# Patient Record
Sex: Female | Born: 1990 | Race: White | Hispanic: No | Marital: Married | Smoking: Never smoker
Health system: Southern US, Community
[De-identification: ages and names within clinical notes are randomized; demographics above are authoritative.]

## PROBLEM LIST (undated history)

## (undated) ENCOUNTER — Inpatient Hospital Stay (HOSPITAL_COMMUNITY): Payer: Self-pay

## (undated) DIAGNOSIS — G43909 Migraine, unspecified, not intractable, without status migrainosus: Secondary | ICD-10-CM

## (undated) HISTORY — PX: ADENOIDECTOMY: SUR15

## (undated) HISTORY — PX: TONSILLECTOMY: SUR1361

## (undated) HISTORY — PX: EYE SURGERY: SHX253

---
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
## 2013-12-08 ENCOUNTER — Other Ambulatory Visit: Payer: Self-pay

## 2013-12-09 LAB — CYTOLOGY - PAP

## 2014-01-23 ENCOUNTER — Encounter (HOSPITAL_COMMUNITY): Payer: Self-pay | Admitting: *Deleted

## 2015-12-14 LAB — OB RESULTS CONSOLE HEPATITIS B SURFACE ANTIGEN: Hepatitis B Surface Ag: NEGATIVE

## 2015-12-14 LAB — OB RESULTS CONSOLE GC/CHLAMYDIA
Chlamydia: NEGATIVE
GC PROBE AMP, GENITAL: NEGATIVE

## 2015-12-14 LAB — OB RESULTS CONSOLE RUBELLA ANTIBODY, IGM: Rubella: IMMUNE

## 2015-12-14 LAB — OB RESULTS CONSOLE GBS: STREP GROUP B AG: NEGATIVE

## 2015-12-14 LAB — OB RESULTS CONSOLE ABO/RH: RH Type: POSITIVE

## 2015-12-14 LAB — OB RESULTS CONSOLE RPR: RPR: NONREACTIVE

## 2015-12-14 LAB — OB RESULTS CONSOLE HIV ANTIBODY (ROUTINE TESTING): HIV: NONREACTIVE

## 2015-12-21 ENCOUNTER — Encounter (HOSPITAL_COMMUNITY): Payer: Self-pay | Admitting: *Deleted

## 2015-12-21 ENCOUNTER — Inpatient Hospital Stay (HOSPITAL_COMMUNITY): Payer: BLUE CROSS/BLUE SHIELD

## 2015-12-21 ENCOUNTER — Inpatient Hospital Stay (HOSPITAL_COMMUNITY)
Admission: AD | Admit: 2015-12-21 | Discharge: 2015-12-21 | Disposition: A | Discharge status: Home / Self Care | Payer: BLUE CROSS/BLUE SHIELD | Source: Ambulatory Visit | Attending: Obstetrics and Gynecology | Admitting: Obstetrics and Gynecology

## 2015-12-21 DIAGNOSIS — O26891 Other specified pregnancy related conditions, first trimester: Secondary | ICD-10-CM | POA: Insufficient documentation

## 2015-12-21 DIAGNOSIS — O26899 Other specified pregnancy related conditions, unspecified trimester: Secondary | ICD-10-CM

## 2015-12-21 DIAGNOSIS — R1032 Left lower quadrant pain: Secondary | ICD-10-CM | POA: Insufficient documentation

## 2015-12-21 DIAGNOSIS — K5900 Constipation, unspecified: Secondary | ICD-10-CM | POA: Diagnosis not present

## 2015-12-21 DIAGNOSIS — O9989 Other specified diseases and conditions complicating pregnancy, childbirth and the puerperium: Secondary | ICD-10-CM

## 2015-12-21 DIAGNOSIS — R109 Unspecified abdominal pain: Secondary | ICD-10-CM | POA: Diagnosis not present

## 2015-12-21 DIAGNOSIS — Z3A09 9 weeks gestation of pregnancy: Secondary | ICD-10-CM | POA: Insufficient documentation

## 2015-12-21 DIAGNOSIS — Z3491 Encounter for supervision of normal pregnancy, unspecified, first trimester: Secondary | ICD-10-CM

## 2015-12-21 LAB — URINALYSIS, ROUTINE W REFLEX MICROSCOPIC
Bilirubin Urine: NEGATIVE
GLUCOSE, UA: NEGATIVE mg/dL
Hgb urine dipstick: NEGATIVE
Ketones, ur: NEGATIVE mg/dL
Nitrite: NEGATIVE
PROTEIN: NEGATIVE mg/dL
Specific Gravity, Urine: <1.005 — ABNORMAL LOW (ref 1.005–1.030)
pH: 5.5 (ref 5.0–8.0)

## 2015-12-21 LAB — URINE MICROSCOPIC-ADD ON
Bacteria, UA: NONE SEEN
RBC / HPF: NONE SEEN RBC/hpf (ref 0–5)

## 2015-12-21 LAB — POCT PREGNANCY, URINE: PREG TEST UR: POSITIVE — AB

## 2015-12-21 MED ORDER — DOCUSATE SODIUM 100 MG PO CAPS: 100.0000 mg | ORAL_CAPSULE | Freq: Two times a day (BID) | ORAL | 0 refills | Status: DC

## 2015-12-21 NOTE — Discharge Instructions (Signed)
Constipation, Adult °Constipation is when a person: °· Poops (has a bowel movement) less than 3 times a week. °· Has a hard time pooping. °· Has poop that is dry, hard, or bigger than normal. °HOME CARE  °· Eat foods with a lot of fiber in them. This includes fruits, vegetables, beans, and whole grains such as brown rice. °· Avoid fatty foods and foods with a lot of sugar. This includes french fries, hamburgers, cookies, candy, and soda. °· If you are not getting enough fiber from food, take products with added fiber in them (supplements). °· Drink enough fluid to keep your pee (urine) clear or pale yellow. °· Exercise on a regular basis, or as told by your doctor. °· Go to the restroom when you feel like you need to poop. Do not hold it. °· Only take medicine as told by your doctor. Do not take medicines that help you poop (laxatives) without talking to your doctor first. °GET HELP RIGHT AWAY IF:  °· You have bright red blood in your poop (stool). °· Your constipation lasts more than 4 days or gets worse. °· You have belly (abdominal) or butt (rectal) pain. °· You have thin poop (as thin as a pencil). °· You lose weight, and it cannot be explained. °MAKE SURE YOU:  °· Understand these instructions. °· Will watch your condition. °· Will get help right away if you are not doing well or get worse. °  °This information is not intended to replace advice given to you by your health care provider. Make sure you discuss any questions you have with your health care provider. °  °Document Released: 08/27/2007 Document Revised: 03/31/2014 Document Reviewed: 12/20/2012 °Elsevier Interactive Patient Education ©2016 Elsevier Inc[employer] °Abdominal Pain During Pregnancy °Belly (abdominal) pain is common during pregnancy. Most of the time, it is not a serious problem. Other times, it can be a sign that something is wrong with the pregnancy. Always tell your doctor if you have belly pain. °HOME CARE °Monitor your belly pain for any  changes. The following actions may help you feel better: °· Do not have sex (intercourse) or put anything in your vagina until you feel better. °· Rest until your pain stops. °· Drink clear fluids if you feel sick to your stomach (nauseous). Do not eat solid food until you feel better. °· Only take medicine as told by your doctor. °· Keep all doctor visits as told. °GET HELP RIGHT AWAY IF:  °· You are bleeding, leaking fluid, or pieces of tissue come out of your vagina. °· You have more pain or cramping. °· You keep throwing up (vomiting). °· You have pain when you pee (urinate) or have blood in your pee. °· You have a fever. °· You do not feel your baby moving as much. °· You feel very weak or feel like passing out. °· You have trouble breathing, with or without belly pain. °· You have a very bad headache and belly pain. °· You have fluid leaking from your vagina and belly pain. °· You keep having watery poop (diarrhea). °· Your belly pain does not go away after resting, or the pain gets worse. °MAKE SURE YOU:  °· Understand these instructions. °· Will watch your condition. °· Will get help right away if you are not doing well or get worse. °  °This information is not intended to replace advice given to you by your health care provider. Make sure you discuss any questions you have with your   you have with your health care provider.   Document Released: 02/26/2009 Document Revised: 11/10/2012 Document Reviewed: 10/07/2012 Elsevier Interactive Patient Education [employer]2016 [employer] Inc[employer]

## 2015-12-21 NOTE — MAU Note (Signed)
Patient states she has been having left sided lower abdominal cramping for past few days that comes and goes.  She has not tried taking any pain medications.  States she called the office and was told it was probably round ligament pain.  States "I've been pregnant before and never had round ligament pain this early."  Pt states pregnancy is an IUI.  Denies vaginal bleeding or discharge, but states she had a yellow/green discharge last week and bought OTC med for yeast infection and now discharge is gone.

## 2015-12-21 NOTE — MAU Provider Note (Signed)
History     CSN: [id number]  Arrival date and time: 12/21/15 1108    First Provider Initiated Contact with Patient 12/21/15 1251        Chief Complaint  Patient presents with  . Abdominal Cramping   HPI Nancy Arnold is a 25 y.o. G2P0101 at [redacted]w[redacted]d who presents with abdominal pain. Symptoms began 2 days ago. Reports LLQ pain that comes & goes and describes as pressure & cramping. Rates pain 3/10. Has not treated. Nothing makes better or worse. Denies n/v/d, vaginal bleeding, vaginal discharge, or dysuria. Endorses some constipation during this pregnancy. Last BM yesterday and prior to that was 3 days before. Pt states she is very anxious as this is an IUI pregnancy.   OB History    Gravida Para Term Preterm AB Living   2 1 0 1 0 1   SAB TAB Ectopic Multiple Live Births   0 0 0 0 1      History reviewed. No pertinent past medical history.  Past Surgical History:  Procedure Laterality Date  . ADENOIDECTOMY    . EYE SURGERY    . EYE SURGERY     age 38  to correct an eye that turned in  . TONSILLECTOMY      Family History  Problem Relation Age of Onset  . Anesthesia problems Other   . Hypertension Father   . Early death Paternal Uncle   . Cancer Paternal Uncle   . Arthritis Maternal Grandmother   . Hearing loss Maternal Grandmother   . Hypertension Maternal Grandmother   . Alcohol abuse Maternal Grandfather   . Arthritis Paternal Grandmother   . Asthma Paternal Grandmother   . COPD Paternal Grandmother   . Hypertension Paternal Grandmother   . Stroke Paternal Grandfather     Social History  Substance Use Topics  . Smoking status: Never Smoker  . Smokeless tobacco: Never Used  . Alcohol use No    Allergies: No Known Allergies  Prescriptions Prior to Admission  Medication Sig Dispense Refill Last Dose  . acetaminophen (TYLENOL) 500 MG tablet Take 500 mg by mouth every 6 (six) hours as needed. As needed for pain   Past Month at Unknown time  . calcium  carbonate (TUMS - DOSED IN MG ELEMENTAL CALCIUM) 500 MG chewable tablet Chew 1 tablet by mouth daily. As needed for heartburn   Past Month at Unknown time  . Prenatal Vit-Fe Fumarate-FA (MULTIVITAMIN-PRENATAL) 27-0.8 MG TABS tablet Take 1 tablet by mouth daily at 12 noon.   12/20/2015 at Unknown time    Review of Systems  Constitutional: Negative.   Gastrointestinal: Positive for abdominal pain and constipation. Negative for diarrhea, nausea and vomiting.  Genitourinary: Negative.    Physical Exam   Blood pressure 127/77, pulse 89, temperature 98.3 F (36.8 C), temperature source Oral, resp. rate 16, height 5\' 4"  (1.626 m), weight 160 lb 12.8 oz (72.9 kg), last menstrual period 10/16/2015, SpO2 100 %, unknown if currently breastfeeding.  Physical Exam  Nursing note and vitals reviewed. Constitutional: She is oriented to person, place, and time. She appears well-developed and well-nourished. No distress.  HENT:  Head: Normocephalic and atraumatic.  Eyes: Conjunctivae are normal. Right eye exhibits no discharge. Left eye exhibits no discharge. No scleral icterus.  Neck: Normal range of motion.  Respiratory: Effort normal. No respiratory distress.  GI: Soft. Bowel sounds are normal. She exhibits no distension. There is no tenderness. There is no rebound and no guarding.  Genitourinary:  Genitourinary Comments: Cervix closed  Neurological: She is alert and oriented to person, place, and time.  Skin: Skin is warm and dry. She is not diaphoretic.  Psychiatric: She has a normal mood and affect. Her behavior is normal. Judgment and thought content normal.    MAU Course  Procedures Results for orders placed or performed during the hospital encounter of 12/21/15 (from the past 24 hour(s))  Urinalysis, Routine w reflex microscopic (not at [facility])     Status: Abnormal   Collection Time: 12/21/15 11:15 AM  Result Value Ref Range   Color, Urine YELLOW YELLOW   APPearance CLEAR CLEAR   Specific  Gravity, Urine <1.005 (L) 1.005 - 1.030   pH 5.5 5.0 - 8.0   Glucose, UA NEGATIVE NEGATIVE mg/dL   Hgb urine dipstick NEGATIVE NEGATIVE   Bilirubin Urine NEGATIVE NEGATIVE   Ketones, ur NEGATIVE NEGATIVE mg/dL   Protein, ur NEGATIVE NEGATIVE mg/dL   Nitrite NEGATIVE NEGATIVE   Leukocytes, UA TRACE (A) NEGATIVE  Urine microscopic-add on     Status: Abnormal   Collection Time: 12/21/15 11:15 AM  Result Value Ref Range   Squamous Epithelial / LPF 0-5 (A) NONE SEEN   WBC, UA 0-5 0 - 5 WBC/hpf   RBC / HPF NONE SEEN 0 - 5 RBC/hpf   Bacteria, UA NONE SEEN NONE SEEN  Pregnancy, urine POC     Status: Abnormal   Collection Time: 12/21/15 11:33 AM  Result Value Ref Range   Preg Test, Ur POSITIVE (A) NEGATIVE   [place] Ob Comp Less 14 Wks  Result Date: 12/21/2015 CLINICAL DATA:  Pregnant patient with abdominal and left lower quadrant pain for 3 days which worsened last night. EXAM: OBSTETRIC <14 WK US AND TRANSVAGINAL OB US TECHNIQUE: Both transabdominal and transvaginal ultrasound examinations were performed for complete evaluation of the gestation as well as the maternal uterus, adnexal regions, and pelvic cul-de-sac. Transvaginal technique was performed to assess early pregnancy. COMPARISON:  None. FINDINGS: Intrauterine gestational sac: Visualized. Yolk sac:  Visualized. Embryo:  Visualized. Cardiac Activity: Detected. Heart Rate: 174  Bpm CRL:  21.0  mm   8 w   4 d                  US EDC: 07/28/2016. Subchorionic hemorrhage:  None visualized. Maternal uterus/adnexae: Corpus luteum cyst on the right is incidentally noted. No free pelvic fluid. IMPRESSION: No acute abnormality.  Single living intrauterine pregnancy. Electronically Signed   By: Drusilla Kannerhomas  Dalessio M.D.   On: 12/21/2015 13:55   [place] Ob Transvaginal  Result Date: 12/21/2015 CLINICAL DATA:  Pelvic pain for 3 days.  Positive pregnancy test. EXAM: OBSTETRIC <14 WK US AND TRANSVAGINAL OB US TECHNIQUE: Both transabdominal and transvaginal  ultrasound examinations were performed for complete evaluation of the gestation as well as the maternal uterus, adnexal regions, and pelvic cul-de-sac. Transvaginal technique was performed to assess early pregnancy. COMPARISON:  None. FINDINGS: Intrauterine gestational sac: Present Yolk sac:  Present Embryo:  Present Cardiac Activity: Present Heart Rate: 170 for  bpm CRL:  21.0  mm   8 w   4 d                  US EDC: 07/28/2016 Subchorionic hemorrhage:  None visualized. Maternal uterus/adnexae: Corpus luteum cyst right ovary. Normal left ovary. No free pelvic fluid IMPRESSION: Single living intrauterine fetus estimated at 8 weeks and 4 days gestation. No subchorionic hemorrhage. Normal ovaries. Electronically Signed   By: Demetrius CharityP.  Gallerani M.D.   On: 12/21/2015 13:56    MDM Cervix closed Ultrasound shows SIUP with cardiac activity. CLC in right ovary Discussed with Dr. Elon Spanner -- ok to discharge home with reassurance Assessment and Plan  A; 1. Normal IUP (intrauterine pregnancy) on prenatal ultrasound, first trimester   2. Abdominal pain affecting pregnancy   3. Constipation, unspecified constipation type    P: Discharge home Rx colace Discussed reasons to return to MAU Keep f/u with OB    Judeth Horn 12/21/2015, 12:51 PM

## 2016-02-27 ENCOUNTER — Encounter: Payer: Self-pay | Admitting: Physician Assistant

## 2016-02-27 ENCOUNTER — Encounter (INDEPENDENT_AMBULATORY_CARE_PROVIDER_SITE_OTHER): Payer: Self-pay

## 2016-02-27 ENCOUNTER — Ambulatory Visit (INDEPENDENT_AMBULATORY_CARE_PROVIDER_SITE_OTHER): Payer: 59 | Admitting: Physician Assistant

## 2016-02-27 VITALS — BP 110/82 | HR 110 | Temp 98.0°F | Ht 64.0 in | Wt 168.0 lb

## 2016-02-27 DIAGNOSIS — J020 Streptococcal pharyngitis: Secondary | ICD-10-CM | POA: Diagnosis not present

## 2016-02-27 DIAGNOSIS — J029 Acute pharyngitis, unspecified: Secondary | ICD-10-CM | POA: Diagnosis not present

## 2016-02-27 LAB — RAPID STREP SCREEN (MED CTR MEBANE ONLY): STREP GP A AG, IA W/REFLEX: POSITIVE — AB

## 2016-02-27 MED ORDER — AMOXICILLIN 500 MG PO CAPS
500.0000 mg | ORAL_CAPSULE | Freq: Three times a day (TID) | ORAL | 0 refills | Status: DC
Start: 2016-02-27 — End: 2016-04-17

## 2016-02-27 NOTE — Patient Instructions (Signed)

## 2016-02-27 NOTE — Progress Notes (Signed)
BP 110/82   Pulse (!) 110   Temp 98 F (36.7 C) (Oral)   Ht 5\' 4"  (1.626 m)   Wt 168 lb (76.2 kg)   LMP 10/16/2015 (Exact Date)   BMI 28.84 kg/m    Subjective:    Patient ID: Nancy Arnold, female    DOB: 12-15-1990, 25 y.o.   MRN: [id number]  HPI: Nancy Arnold is a 25 y.o. female presenting on 02/27/2016 for Cough ([redacted] weeks pregnant ); Sore Throat; and Hoarse  This is a new patient to us. She has had this for about 4 days now. It has been worsening with body aches and fever. She had a significant sore throat. Patient is [redacted] weeks pregnant. She has had a significant problem with morning sickness. Otherwise her pregnancy has been uneventful. She has no known allergies.  History reviewed. No pertinent past medical history. Relevant past medical, surgical, family and social history reviewed and updated as indicated. Interim medical history since our last visit reviewed. Allergies and medications reviewed and updated. DATA REVIEWED: CHART IN EPIC  Social History   Social History  . Marital status: Married    Spouse name: N/A  . Number of children: N/A  . Years of education: N/A   Occupational History  . Not on file.   Social History Main Topics  . Smoking status: Never Smoker  . Smokeless tobacco: Never Used  . Alcohol use No  . Drug use: No  . Sexual activity: Yes    Birth control/ protection: None   Other Topics Concern  . Not on file   Social History Narrative  . No narrative on file    Past Surgical History:  Procedure Laterality Date  . ADENOIDECTOMY    . EYE SURGERY    . EYE SURGERY     age 42  to correct an eye that turned in  . TONSILLECTOMY      Family History  Problem Relation Age of Onset  . Anesthesia problems Other   . Hypertension Father   . Early death Paternal Uncle   . Cancer Paternal Uncle   . Arthritis Maternal Grandmother   . Hearing loss Maternal Grandmother   . Hypertension Maternal Grandmother   . Alcohol abuse Maternal  Grandfather   . Arthritis Paternal Grandmother   . Asthma Paternal Grandmother   . COPD Paternal Grandmother   . Hypertension Paternal Grandmother   . Stroke Paternal Grandfather     Review of Systems  Constitutional: Positive for chills, fatigue and fever. Negative for activity change and appetite change.  HENT: Positive for congestion, postnasal drip and sore throat. Negative for ear pain.   Eyes: Negative.   Respiratory: Positive for cough and wheezing.   Cardiovascular: Negative.  Negative for chest pain, palpitations and leg swelling.  Gastrointestinal: Negative.   Genitourinary: Negative.   Musculoskeletal: Negative.   Skin: Negative.   Neurological: Positive for headaches.      Medication List       Accurate as of 02/27/16 11:10 AM. Always use your most recent med list.          amoxicillin 500 MG capsule Commonly known as:  AMOXIL Take 1 capsule (500 mg total) by mouth 3 (three) times daily.   multivitamin-prenatal 27-0.8 MG Tabs tablet Take 1 tablet by mouth daily at 12 noon.          Objective:    BP 110/82   Pulse (!) 110   Temp 98 F (36.7  C) (Oral)   Ht 5\' 4"  (1.626 m)   Wt 168 lb (76.2 kg)   LMP 10/16/2015 (Exact Date)   BMI 28.84 kg/m   No Known Allergies  Wt Readings from Last 3 Encounters:  02/27/16 168 lb (76.2 kg)  12/21/15 160 lb 12.8 oz (72.9 kg)  07/25/11 181 lb 11.2 oz (82.4 kg) (95 %, Z= 1.66)*   * Growth percentiles are based on CDC 2-20 Years data.    Physical Exam  Constitutional: She is oriented to person, place, and time. She appears well-developed and well-nourished.  HENT:  Head: Normocephalic and atraumatic.  Right Ear: A middle ear effusion is present.  Left Ear: A middle ear effusion is present.  Nose: Mucosal edema present. Right sinus exhibits no frontal sinus tenderness. Left sinus exhibits no frontal sinus tenderness.  Mouth/Throat: Oropharyngeal exudate, posterior oropharyngeal edema and posterior oropharyngeal  erythema present. No tonsillar abscesses.  Eyes: Conjunctivae and EOM are normal. Pupils are equal, round, and reactive to light.  Neck: Normal range of motion.  Cardiovascular: Normal rate, regular rhythm, normal heart sounds and intact distal pulses.   Pulmonary/Chest: Effort normal and breath sounds normal.  Abdominal: Soft. Bowel sounds are normal.  Neurological: She is alert and oriented to person, place, and time. She has normal reflexes.  Skin: Skin is warm and dry. No rash noted.  Psychiatric: She has a normal mood and affect. Her behavior is normal. Judgment and thought content normal.  Nursing note and vitals reviewed.       Assessment & Plan:   1. Sore throat - Rapid strep screen (not at [facility])  2. Streptococcal sore throat - amoxicillin (AMOXIL) 500 MG capsule; Take 1 capsule (500 mg total) by mouth 3 (three) times daily.  Dispense: 30 capsule; Refill: 0   Continue all other maintenance medications as listed above.  Follow up plan: Return if symptoms worsen or fail to improve.  Orders Placed This Encounter  Procedures  . Rapid strep screen (not at [facility])    Educational handout given for strep pharyngitis  Remus LofflerAngel S. Marijean Montanye PA-C [facility] [address]  [place], [place] [id number] [phone]   02/27/2016, 11:10 AM

## 2016-03-24 NOTE — L&D Delivery Note (Addendum)
Delivery Note At 1:46 AM a viable female was delivered via Vaginal, Spontaneous Delivery (Presentation: DOA;  ).  APGAR: pending, ; weight pending .   Placenta status: Normal except possible short cord, .  Cord: mildly short, with the following complications: normal.  Cord pH: sent  Anesthesia:  CLE Episiotomy:  None Lacerations:  None Suture Repair: None Est. Blood Loss (mL):  200cc  Mom to postpartum.  Baby to Couplet care / Skin to Skin.  It's a boy - "Nancy Arnold" to join brother, [name] 07/06/2016, 1:56 AM

## 2016-04-17 ENCOUNTER — Ambulatory Visit (INDEPENDENT_AMBULATORY_CARE_PROVIDER_SITE_OTHER): Payer: 59 | Admitting: Physician Assistant

## 2016-04-17 ENCOUNTER — Encounter: Payer: Self-pay | Admitting: Physician Assistant

## 2016-04-17 VITALS — BP 107/68 | HR 96 | Temp 98.1°F | Ht 64.0 in | Wt 174.2 lb

## 2016-04-17 DIAGNOSIS — Z0000 Encounter for general adult medical examination without abnormal findings: Secondary | ICD-10-CM | POA: Diagnosis not present

## 2016-04-17 NOTE — Patient Instructions (Signed)

## 2016-04-17 NOTE — Progress Notes (Signed)
BP 107/68   Pulse 96   Temp 98.1 F (36.7 C) (Oral)   Ht _0  (1.626 m)   Wt 174 lb 3.2 oz (79 kg)   LMP 10/16/2015 (Exact Date)   BMI 29.90 kg/m    Subjective:    Patient ID: Nancy Arnold, female    DOB: 1990/06/27, 26 y.o.   MRN: [id number]  HPI: Nancy Arnold is a 26 y.o. female presenting on 04/17/2016 for Annual Exam  This patient comes in for annual well physical examination. All medications are reviewed today. There are no reports of any problems with the medications. All of the medical conditions are reviewed and updated.  Lab work is reviewed and will be ordered as medically necessary. There are no new problems reported with today's visit.  Patient reports doing well overall.   History reviewed. No pertinent past medical history. Relevant past medical, surgical, family and social history reviewed and updated as indicated. Interim medical history since our last visit reviewed. Allergies and medications reviewed and updated. DATA REVIEWED: CHART IN EPIC  Social History   Social History  . Marital status: Married    Spouse name: N/A  . Number of children: N/A  . Years of education: N/A   Occupational History  . Not on file.   Social History Main Topics  . Smoking status: Never Smoker  . Smokeless tobacco: Never Used  . Alcohol use No  . Drug use: No  . Sexual activity: Yes    Birth control/ protection: None   Other Topics Concern  . Not on file   Social History Narrative  . No narrative on file    Past Surgical History:  Procedure Laterality Date  . ADENOIDECTOMY    . EYE SURGERY    . EYE SURGERY     age 14  to correct an eye that turned in  . TONSILLECTOMY      Family History  Problem Relation Age of Onset  . Anesthesia problems Other   . Hypertension Father   . Early death Paternal Uncle   . Cancer Paternal Uncle   . Arthritis Maternal Grandmother   . Hearing loss Maternal Grandmother   . Hypertension Maternal Grandmother   .  Alcohol abuse Maternal Grandfather   . Arthritis Paternal Grandmother   . Asthma Paternal Grandmother   . COPD Paternal Grandmother   . Hypertension Paternal Grandmother   . Stroke Paternal Grandfather     Review of Systems  Constitutional: Negative.  Negative for activity change, fatigue and fever.  HENT: Negative.   Eyes: Negative.   Respiratory: Negative.  Negative for cough.   Cardiovascular: Negative.  Negative for chest pain.  Gastrointestinal: Negative.  Negative for abdominal pain.  Endocrine: Negative.   Genitourinary: Negative.  Negative for dysuria.  Musculoskeletal: Negative.   Skin: Negative.   Neurological: Negative.     Allergies as of 04/17/2016   No Known Allergies     Medication List       Accurate as of 04/17/16 10:17 PM. Always use your most recent med list.          multivitamin-prenatal 27-0.8 MG Tabs tablet Take 1 tablet by mouth daily at 12 noon.          Objective:    BP 107/68   Pulse 96   Temp 98.1 F (36.7 C) (Oral)   Ht _1  (1.626 m)   Wt 174 lb 3.2 oz (79 kg)   LMP 10/16/2015 (Exact  Date)   BMI 29.90 kg/m   No Known Allergies  Wt Readings from Last 3 Encounters:  04/17/16 174 lb 3.2 oz (79 kg)  02/27/16 168 lb (76.2 kg)  12/21/15 160 lb 12.8 oz (72.9 kg)    Physical Exam  Constitutional: She is oriented to person, place, and time. She appears well-developed and well-nourished.  HENT:  Head: Normocephalic and atraumatic.  Right Ear: Tympanic membrane, external ear and ear canal normal.  Left Ear: Tympanic membrane, external ear and ear canal normal.  Nose: Nose normal. No rhinorrhea.  Mouth/Throat: Oropharynx is clear and moist and mucous membranes are normal. No oropharyngeal exudate or posterior oropharyngeal erythema.  Eyes: Conjunctivae and EOM are normal. Pupils are equal, round, and reactive to light.  Neck: Normal range of motion. Neck supple.  Cardiovascular: Normal rate, regular rhythm, normal heart sounds and  intact distal pulses.   Pulmonary/Chest: Effort normal and breath sounds normal.  Abdominal: Soft. Bowel sounds are normal.  Neurological: She is alert and oriented to person, place, and time. She has normal reflexes.  Skin: Skin is warm and dry. No rash noted.  Psychiatric: She has a normal mood and affect. Her behavior is normal. Judgment and thought content normal.        Assessment & Plan:   1. Well adult exam - CMP14+EGFR - CBC with Differential/Platelet - Lipid panel   Continue all other maintenance medications as listed above.  Follow up plan: Return if symptoms worsen or fail to improve.  Orders Placed This Encounter  Procedures  . CMP14+EGFR  . CBC with Differential/Platelet  . Lipid panel    Educational handout given for health maintenance  Terald Sleeper PA-C [facility] [address]  [place], [place] [address] [phone]   04/17/2016, 10:17 PM

## 2016-04-18 LAB — CBC WITH DIFFERENTIAL/PLATELET
BASOS ABS: 0 10*3/uL (ref 0.0–0.2)
BASOS: 0 %
EOS (ABSOLUTE): 0.1 10*3/uL (ref 0.0–0.4)
Eos: 1 %
Hematocrit: 36.8 % (ref 34.0–46.6)
Hemoglobin: 12.2 g/dL (ref 11.1–15.9)
IMMATURE GRANS (ABS): 0.1 10*3/uL (ref 0.0–0.1)
IMMATURE GRANULOCYTES: 1 %
LYMPHS: 11 %
Lymphocytes Absolute: 1.5 10*3/uL (ref 0.7–3.1)
MCH: 29.3 pg (ref 26.6–33.0)
MCHC: 33.2 g/dL (ref 31.5–35.7)
MCV: 88 fL (ref 79–97)
MONOS ABS: 0.5 10*3/uL (ref 0.1–0.9)
Monocytes: 4 %
NEUTROS PCT: 83 %
Neutrophils Absolute: 10.8 10*3/uL — ABNORMAL HIGH (ref 1.4–7.0)
PLATELETS: 227 10*3/uL (ref 150–379)
RBC: 4.17 x10E6/uL (ref 3.77–5.28)
RDW: 13.6 % (ref 12.3–15.4)
WBC: 13.1 10*3/uL — AB (ref 3.4–10.8)

## 2016-04-18 LAB — CMP14+EGFR
A/G RATIO: 1.5 (ref 1.2–2.2)
ALK PHOS: 103 IU/L (ref 39–117)
ALT: 12 IU/L (ref 0–32)
AST: 12 IU/L (ref 0–40)
Albumin: 3.8 g/dL (ref 3.5–5.5)
BUN/Creatinine Ratio: 9 (ref 9–23)
BUN: 6 mg/dL (ref 6–20)
Bilirubin Total: 0.2 mg/dL (ref 0.0–1.2)
CALCIUM: 9 mg/dL (ref 8.7–10.2)
CHLORIDE: 100 mmol/L (ref 96–106)
CO2: 21 mmol/L (ref 18–29)
Creatinine, Ser: 0.69 mg/dL (ref 0.57–1.00)
GFR calc Af Amer: 142 mL/min/{1.73_m2} (ref >59)
GFR, EST NON AFRICAN AMERICAN: 123 mL/min/{1.73_m2} (ref >59)
Globulin, Total: 2.5 g/dL (ref 1.5–4.5)
Glucose: 68 mg/dL (ref 65–99)
POTASSIUM: 4.2 mmol/L (ref 3.5–5.2)
SODIUM: 140 mmol/L (ref 134–144)
Total Protein: 6.3 g/dL (ref 6.0–8.5)

## 2016-04-18 LAB — LIPID PANEL
CHOLESTEROL TOTAL: 203 mg/dL — AB (ref 100–199)
Chol/HDL Ratio: 3.3 ratio units (ref 0.0–4.4)
HDL: 62 mg/dL (ref >39)
LDL Calculated: 105 mg/dL — ABNORMAL HIGH (ref 0–99)
Triglycerides: 179 mg/dL — ABNORMAL HIGH (ref 0–149)
VLDL Cholesterol Cal: 36 mg/dL (ref 5–40)

## 2016-04-22 DIAGNOSIS — Z23 Encounter for immunization: Secondary | ICD-10-CM | POA: Diagnosis not present

## 2016-07-05 ENCOUNTER — Inpatient Hospital Stay (HOSPITAL_COMMUNITY)
Admission: AD | Admit: 2016-07-05 | Discharge: 2016-07-07 | DRG: 775 | Disposition: A | Discharge status: Home / Self Care | Payer: 59 | Source: Ambulatory Visit | Attending: Obstetrics and Gynecology | Admitting: Obstetrics and Gynecology

## 2016-07-05 ENCOUNTER — Inpatient Hospital Stay (HOSPITAL_COMMUNITY): Payer: 59 | Admitting: Anesthesiology

## 2016-07-05 ENCOUNTER — Encounter (HOSPITAL_COMMUNITY): Payer: Self-pay

## 2016-07-05 ENCOUNTER — Inpatient Hospital Stay (EMERGENCY_DEPARTMENT_HOSPITAL)
Admission: AD | Admit: 2016-07-05 | Discharge: 2016-07-05 | Disposition: A | Discharge status: Home / Self Care | Payer: 59 | Source: Ambulatory Visit | Attending: Obstetrics and Gynecology | Admitting: Obstetrics and Gynecology

## 2016-07-05 ENCOUNTER — Encounter (HOSPITAL_COMMUNITY): Payer: Self-pay | Admitting: Certified Nurse Midwife

## 2016-07-05 DIAGNOSIS — O4290 Premature rupture of membranes, unspecified as to length of time between rupture and onset of labor, unspecified weeks of gestation: Secondary | ICD-10-CM | POA: Diagnosis not present

## 2016-07-05 DIAGNOSIS — O26893 Other specified pregnancy related conditions, third trimester: Secondary | ICD-10-CM

## 2016-07-05 DIAGNOSIS — N898 Other specified noninflammatory disorders of vagina: Secondary | ICD-10-CM

## 2016-07-05 DIAGNOSIS — Z823 Family history of stroke: Secondary | ICD-10-CM

## 2016-07-05 DIAGNOSIS — Z3A37 37 weeks gestation of pregnancy: Secondary | ICD-10-CM | POA: Insufficient documentation

## 2016-07-05 DIAGNOSIS — Z3490 Encounter for supervision of normal pregnancy, unspecified, unspecified trimester: Secondary | ICD-10-CM

## 2016-07-05 DIAGNOSIS — Z8249 Family history of ischemic heart disease and other diseases of the circulatory system: Secondary | ICD-10-CM

## 2016-07-05 DIAGNOSIS — O4292 Full-term premature rupture of membranes, unspecified as to length of time between rupture and onset of labor: Secondary | ICD-10-CM | POA: Diagnosis not present

## 2016-07-05 LAB — TYPE AND SCREEN
ABO/RH(D): O POS
Antibody Screen: NEGATIVE

## 2016-07-05 LAB — CBC
HEMATOCRIT: 32.6 % — AB (ref 36.0–46.0)
HEMOGLOBIN: 10.4 g/dL — AB (ref 12.0–15.0)
MCH: 24.5 pg — AB (ref 26.0–34.0)
MCHC: 31.9 g/dL (ref 30.0–36.0)
MCV: 76.9 fL — ABNORMAL LOW (ref 78.0–100.0)
Platelets: 250 10*3/uL (ref 150–400)
RBC: 4.24 MIL/uL (ref 3.87–5.11)
RDW: 14.5 % (ref 11.5–15.5)
WBC: 12.7 10*3/uL — ABNORMAL HIGH (ref 4.0–10.5)

## 2016-07-05 LAB — ABO/RH: ABO/RH(D): O POS

## 2016-07-05 LAB — POCT FERN TEST: POCT Fern Test: POSITIVE

## 2016-07-05 LAB — AMNISURE RUPTURE OF MEMBRANE (ROM) NOT AT ARMC: Amnisure ROM: NEGATIVE

## 2016-07-05 MED ORDER — OXYTOCIN 40 UNITS IN LACTATED RINGERS INFUSION - SIMPLE MED
1.0000 m[IU]/min | INTRAVENOUS | Status: DC
  Administered 2016-07-05: 2 m[IU]/min via INTRAVENOUS

## 2016-07-05 MED ORDER — LIDOCAINE HCL (PF) 1 % IJ SOLN
30.0000 mL | INTRAMUSCULAR | Status: DC | PRN
Start: 2016-07-05 — End: 2016-07-06
  Filled 2016-07-05: qty 30

## 2016-07-05 MED ORDER — LACTATED RINGERS IV SOLN
500.0000 mL | Freq: Once | INTRAVENOUS | Status: AC
  Administered 2016-07-05: 500 mL via INTRAVENOUS

## 2016-07-05 MED ORDER — OXYTOCIN BOLUS FROM INFUSION
500.0000 mL | Freq: Once | INTRAVENOUS | Status: AC
  Administered 2016-07-06: 500 mL via INTRAVENOUS

## 2016-07-05 MED ORDER — ZOLPIDEM TARTRATE 5 MG PO TABS
5.0000 mg | ORAL_TABLET | Freq: Every evening | ORAL | Status: DC | PRN
  Administered 2016-07-05: 5 mg via ORAL
  Filled 2016-07-05: qty 1

## 2016-07-05 MED ORDER — PHENYLEPHRINE 40 MCG/ML (10ML) SYRINGE FOR IV PUSH (FOR BLOOD PRESSURE SUPPORT)
80.0000 ug | PREFILLED_SYRINGE | INTRAVENOUS | Status: DC | PRN
  Filled 2016-07-05: qty 5

## 2016-07-05 MED ORDER — LIDOCAINE HCL (PF) 1 % IJ SOLN
INTRAMUSCULAR | Status: DC | PRN
  Administered 2016-07-05 (×2): 5 mL via EPIDURAL

## 2016-07-05 MED ORDER — EPHEDRINE 5 MG/ML INJ
10.0000 mg | INTRAVENOUS | Status: DC | PRN
  Filled 2016-07-05: qty 2

## 2016-07-05 MED ORDER — OXYCODONE-ACETAMINOPHEN 5-325 MG PO TABS: 1.0000 | ORAL_TABLET | ORAL | Status: DC | PRN

## 2016-07-05 MED ORDER — PHENYLEPHRINE 40 MCG/ML (10ML) SYRINGE FOR IV PUSH (FOR BLOOD PRESSURE SUPPORT)
80.0000 ug | PREFILLED_SYRINGE | INTRAVENOUS | Status: DC | PRN
  Filled 2016-07-05: qty 5
  Filled 2016-07-05: qty 10

## 2016-07-05 MED ORDER — SOD CITRATE-CITRIC ACID 500-334 MG/5ML PO SOLN: 30.0000 mL | ORAL | Status: DC | PRN

## 2016-07-05 MED ORDER — LACTATED RINGERS IV SOLN
INTRAVENOUS | Status: DC
  Administered 2016-07-05: 17:00:00 via INTRAVENOUS

## 2016-07-05 MED ORDER — OXYTOCIN 40 UNITS IN LACTATED RINGERS INFUSION - SIMPLE MED: 2.5000 [IU]/h | INTRAVENOUS | Status: DC

## 2016-07-05 MED ORDER — TERBUTALINE SULFATE 1 MG/ML IJ SOLN
0.2500 mg | Freq: Once | INTRAMUSCULAR | Status: DC | PRN
  Filled 2016-07-05: qty 1

## 2016-07-05 MED ORDER — OXYCODONE-ACETAMINOPHEN 5-325 MG PO TABS: 2.0000 | ORAL_TABLET | ORAL | Status: DC | PRN

## 2016-07-05 MED ORDER — ONDANSETRON HCL 4 MG/2ML IJ SOLN
4.0000 mg | Freq: Four times a day (QID) | INTRAMUSCULAR | Status: DC | PRN
  Administered 2016-07-06: 4 mg via INTRAVENOUS
  Filled 2016-07-05: qty 2

## 2016-07-05 MED ORDER — FLEET ENEMA 7-19 GM/118ML RE ENEM: 1.0000 | ENEMA | RECTAL | Status: DC | PRN

## 2016-07-05 MED ORDER — ACETAMINOPHEN 325 MG PO TABS
650.0000 mg | ORAL_TABLET | ORAL | Status: DC | PRN
  Administered 2016-07-05: 650 mg via ORAL
  Filled 2016-07-05: qty 2

## 2016-07-05 MED ORDER — LACTATED RINGERS IV SOLN
500.0000 mL | INTRAVENOUS | Status: DC | PRN
Start: 2016-07-05 — End: 2016-07-06

## 2016-07-05 MED ORDER — OXYTOCIN 40 UNITS IN LACTATED RINGERS INFUSION - SIMPLE MED
INTRAVENOUS | Status: AC
  Administered 2016-07-05: 2 m[IU]/min via INTRAVENOUS
  Filled 2016-07-05: qty 1000

## 2016-07-05 MED ORDER — FENTANYL 2.5 MCG/ML BUPIVACAINE 1/10 % EPIDURAL INFUSION (WH - ANES)
14.0000 mL/h | INTRAMUSCULAR | Status: DC | PRN
  Filled 2016-07-05: qty 100

## 2016-07-05 MED ORDER — DIPHENHYDRAMINE HCL 50 MG/ML IJ SOLN: 12.5000 mg | INTRAMUSCULAR | Status: DC | PRN

## 2016-07-05 NOTE — Anesthesia Procedure Notes (Signed)
Epidural Patient location during procedure: OB Start time: 07/05/2016 7:42 PM  Staffing Anesthesiologist: Odette Fraction Performed: anesthesiologist   Preanesthetic Checklist Completed: patient identified, site marked, surgical consent, pre-op evaluation, timeout performed, IV checked, risks and benefits discussed and monitors and equipment checked  Epidural Patient position: sitting Prep: DuraPrep Patient monitoring: heart rate, continuous pulse ox and blood pressure Approach: midline Location: L3-L4 Injection technique: LOR air and LOR saline  Needle:  Needle type: Tuohy  Needle gauge: 18 G Needle length: 9 cm Needle insertion depth: 7 cm Catheter type: closed end flexible Catheter size: 20 Guage Catheter at skin depth: 12 cm  Assessment Sensory level: T9  Additional Notes I was present and participated in the regional anesthetic procedure key events.  By signing, I attest that I have identified and re-evaluated the patient immediately before the induction of anesthesia and I am satisfied that my anesthetic plan is suitable for the patient's condition and procedure.

## 2016-07-05 NOTE — Discharge Instructions (Signed)
Fetal Movement Counts Patient Name: ________________________________________________ Patient Due Date: ____________________ What is a fetal movement count? A fetal movement count is the number of times that you feel your baby move during a certain amount of time. This may also be called a fetal kick count. A fetal movement count is recommended for every pregnant woman. You may be asked to start counting fetal movements as early as week 28 of your pregnancy. Pay attention to when your baby is most active. You may notice your baby's sleep and wake cycles. You may also notice things that make your baby move more. You should do a fetal movement count:  When your baby is normally most active.  At the same time each day. A good time to count movements is while you are resting, after having something to eat and drink. How do I count fetal movements? 1. Find a quiet, comfortable area. Sit, or lie down on your side. 2. Write down the date, the start time and stop time, and the number of movements that you felt between those two times. Take this information with you to your health care visits. 3. For 2 hours, count kicks, flutters, swishes, rolls, and jabs. You should feel at least 10 movements during 2 hours. 4. You may stop counting after you have felt 10 movements. 5. If you do not feel 10 movements in 2 hours, have something to eat and drink. Then, keep resting and counting for 1 hour. If you feel at least 4 movements during that hour, you may stop counting. Contact a health care provider if:  You feel fewer than 4 movements in 2 hours.  Your baby is not moving like he or she usually does. Date: ____________ Start time: ____________ Stop time: ____________ Movements: ____________ Date: ____________ Start time: ____________ Stop time: ____________ Movements: ____________ Date: ____________ Start time: ____________ Stop time: ____________ Movements: ____________ Date: ____________ Start time:  ____________ Stop time: ____________ Movements: ____________ Date: ____________ Start time: ____________ Stop time: ____________ Movements: ____________ Date: ____________ Start time: ____________ Stop time: ____________ Movements: ____________ Date: ____________ Start time: ____________ Stop time: ____________ Movements: ____________ Date: ____________ Start time: ____________ Stop time: ____________ Movements: ____________ Date: ____________ Start time: ____________ Stop time: ____________ Movements: ____________ This information is not intended to replace advice given to you by your health care provider. Make sure you discuss any questions you have with your health care provider. Document Released: 04/09/2006 Document Revised: 11/07/2015 Document Reviewed: 04/19/2015 Elsevier Interactive Patient Education  2017 [employer]. Third Trimester of Pregnancy The third trimester is from week 28 through week 40 (months 7 through 9). The third trimester is a time when the unborn baby (fetus) is growing rapidly. At the end of the ninth month, the fetus is about 20 inches in length and weighs 6-10 pounds. Body changes during your third trimester Your body will continue to go through many changes during pregnancy. The changes vary from woman to woman. During the third trimester:  Your weight will continue to increase. You can expect to gain 25-35 pounds (11-16 kg) by the end of the pregnancy.  You may begin to get stretch marks on your hips, abdomen, and breasts.  You may urinate more often because the fetus is moving lower into your pelvis and pressing on your bladder.  You may develop or continue to have heartburn. This is caused by increased hormones that slow down muscles in the digestive tract.  You may develop or continue to have constipation because increased hormones  slow digestion and cause the muscles that push waste through your intestines to relax.  You may develop hemorrhoids. These are  swollen veins (varicose veins) in the rectum that can itch or be painful.  You may develop swollen, bulging veins (varicose veins) in your legs.  You may have increased body aches in the pelvis, back, or thighs. This is due to weight gain and increased hormones that are relaxing your joints.  You may have changes in your hair. These can include thickening of your hair, rapid growth, and changes in texture. Some women also have hair loss during or after pregnancy, or hair that feels dry or thin. Your hair will most likely return to normal after your baby is born.  Your breasts will continue to grow and they will continue to become tender. A yellow fluid (colostrum) may leak from your breasts. This is the first milk you are producing for your baby.  Your belly button may stick out.  You may notice more swelling in your hands, face, or ankles.  You may have increased tingling or numbness in your hands, arms, and legs. The skin on your belly may also feel numb.  You may feel short of breath because of your expanding uterus.  You may have more problems sleeping. This can be caused by the size of your belly, increased need to urinate, and an increase in your body's metabolism.  You may notice the fetus "dropping," or moving lower in your abdomen (lightening).  You may have increased vaginal discharge.  You may notice your joints feel loose and you may have pain around your pelvic bone. What to expect at prenatal visits You will have prenatal exams every 2 weeks until week 36. Then you will have weekly prenatal exams. During a routine prenatal visit:  You will be weighed to make sure you and the baby are growing normally.  Your blood pressure will be taken.  Your abdomen will be measured to track your baby's growth.  The fetal heartbeat will be listened to.  Any test results from the previous visit will be discussed.  You may have a cervical check near your due date to see if your  cervix has softened or thinned (effaced).  You will be tested for Group B streptococcus. This happens between 35 and 37 weeks. Your health care provider may ask you:  What your birth plan is.  How you are feeling.  If you are feeling the baby move.  If you have had any abnormal symptoms, such as leaking fluid, bleeding, severe headaches, or abdominal cramping.  If you are using any tobacco products, including cigarettes, chewing tobacco, and electronic cigarettes.  If you have any questions. Other tests or screenings that may be performed during your third trimester include:  Blood tests that check for low iron levels (anemia).  Fetal testing to check the health, activity level, and growth of the fetus. Testing is done if you have certain medical conditions or if there are problems during the pregnancy.  Nonstress test (NST). This test checks the health of your baby to make sure there are no signs of problems, such as the baby not getting enough oxygen. During this test, a belt is placed around your belly. The baby is made to move, and its heart rate is monitored during movement. What is false labor? False labor is a condition in which you feel small, irregular tightenings of the muscles in the womb (contractions) that usually go away with rest, changing  position, or drinking water. These are called Braxton Hicks contractions. Contractions may last for hours, days, or even weeks before true labor sets in. If contractions come at regular intervals, become more frequent, increase in intensity, or become painful, you should see your health care provider. What are the signs of labor?  Abdominal cramps.  Regular contractions that start at 10 minutes apart and become stronger and more frequent with time.  Contractions that start on the top of the uterus and spread down to the lower abdomen and back.  Increased pelvic pressure and dull back pain.  A watery or bloody mucus discharge that  comes from the vagina.  Leaking of amniotic fluid. This is also known as your "water breaking." It could be a slow trickle or a gush. Let your health care provider know if it has a color or strange odor. If you have any of these signs, call your health care provider right away, even if it is before your due date. Follow these instructions at home: Medicines   Follow your health care provider's instructions regarding medicine use. Specific medicines may be either safe or unsafe to take during pregnancy.  Take a prenatal vitamin that contains at least 600 micrograms (mcg) of folic acid.  If you develop constipation, try taking a stool softener if your health care provider approves. Eating and drinking   Eat a balanced diet that includes fresh fruits and vegetables, whole grains, good sources of protein such as meat, eggs, or tofu, and low-fat dairy. Your health care provider will help you determine the amount of weight gain that is right for you.  Avoid raw meat and uncooked cheese. These carry germs that can cause birth defects in the baby.  If you have low calcium intake from food, talk to your health care provider about whether you should take a daily calcium supplement.  Eat four or five small meals rather than three large meals a day.  Limit foods that are high in fat and processed sugars, such as fried and sweet foods.  To prevent constipation:  Drink enough fluid to keep your urine clear or pale yellow.  Eat foods that are high in fiber, such as fresh fruits and vegetables, whole grains, and beans. Activity   Exercise only as directed by your health care provider. Most women can continue their usual exercise routine during pregnancy. Try to exercise for 30 minutes at least 5 days a week. Stop exercising if you experience uterine contractions.  Avoid heavy lifting.  Do not exercise in extreme heat or humidity, or at high altitudes.  Wear low-heel, comfortable  shoes.  Practice good posture.  You may continue to have sex unless your health care provider tells you otherwise. Relieving pain and discomfort   Take frequent breaks and rest with your legs elevated if you have leg cramps or low back pain.  Take warm sitz baths to soothe any pain or discomfort caused by hemorrhoids. Use hemorrhoid cream if your health care provider approves.  Wear a good support bra to prevent discomfort from breast tenderness.  If you develop varicose veins:  Wear support pantyhose or compression stockings as told by your healthcare provider.  Elevate your feet for 15 minutes, 3-4 times a day. Prenatal care   Write down your questions. Take them to your prenatal visits.  Keep all your prenatal visits as told by your health care provider. This is important. Safety   Wear your seat belt at all times when driving.  Make a list of emergency phone numbers, including numbers for family, friends, the hospital, and police and fire departments. General instructions   Avoid cat litter boxes and soil used by cats. These carry germs that can cause birth defects in the baby. If you have a cat, ask someone to clean the litter box for you.  Do not travel far distances unless it is absolutely necessary and only with the approval of your health care provider.  Do not use hot tubs, steam rooms, or saunas.  Do not drink alcohol.  Do not use any products that contain nicotine or tobacco, such as cigarettes and e-cigarettes. If you need help quitting, ask your health care provider.  Do not use any medicinal herbs or unprescribed drugs. These chemicals affect the formation and growth of the baby.  Do not douche or use tampons or scented sanitary pads.  Do not cross your legs for long periods of time.  To prepare for the arrival of your baby:  Take prenatal classes to understand, practice, and ask questions about labor and delivery.  Make a trial run to the  hospital.  Visit the hospital and tour the maternity area.  Arrange for maternity or paternity leave through employers.  Arrange for family and friends to take care of pets while you are in the hospital.  Purchase a rear-facing car seat and make sure you know how to install it in your car.  Pack your hospital bag.  Prepare the babys nursery. Make sure to remove all pillows and stuffed animals from the baby's crib to prevent suffocation.  Visit your dentist if you have not gone during your pregnancy. Use a soft toothbrush to brush your teeth and be gentle when you floss. Contact a health care provider if:  You are unsure if you are in labor or if your water has broken.  You become dizzy.  You have mild pelvic cramps, pelvic pressure, or nagging pain in your abdominal area.  You have lower back pain.  You have persistent nausea, vomiting, or diarrhea.  You have an unusual or bad smelling vaginal discharge.  You have pain when you urinate. Get help right away if:  Your water breaks before 37 weeks.  You have regular contractions less than 5 minutes apart before 37 weeks.  You have a fever.  You are leaking fluid from your vagina.  You have spotting or bleeding from your vagina.  You have severe abdominal pain or cramping.  You have rapid weight loss or weight gain.  You have shortness of breath with chest pain.  You notice sudden or extreme swelling of your face, hands, ankles, feet, or legs.  Your baby makes fewer than 10 movements in 2 hours.  You have severe headaches that do not go away when you take medicine.  You have vision changes. Summary  The third trimester is from week 28 through week 40, months 7 through 9. The third trimester is a time when the unborn baby (fetus) is growing rapidly.  During the third trimester, your discomfort may increase as you and your baby continue to gain weight. You may have abdominal, leg, and back pain, sleeping  problems, and an increased need to urinate.  During the third trimester your breasts will keep growing and they will continue to become tender. A yellow fluid (colostrum) may leak from your breasts. This is the first milk you are producing for your baby.  False labor is a condition in which you feel small,  irregular tightenings of the muscles in the womb (contractions) that eventually go away. These are called Braxton Hicks contractions. Contractions may last for hours, days, or even weeks before true labor sets in.  Signs of labor can include: abdominal cramps; regular contractions that start at 10 minutes apart and become stronger and more frequent with time; watery or bloody mucus discharge that comes from the vagina; increased pelvic pressure and dull back pain; and leaking of amniotic fluid. This information is not intended to replace advice given to you by your health care provider. Make sure you discuss any questions you have with your health care provider. Document Released: 03/04/2001 Document Revised: 08/16/2015 Document Reviewed: 05/11/2012 Elsevier Interactive Patient Education  2017 [employer]

## 2016-07-05 NOTE — MAU Note (Signed)
Patient present to MAU with c/o leaking fluid. Thinks water broke around 0400. Not feeling any contractions. +FM. Denies bleeding.

## 2016-07-05 NOTE — MAU Note (Signed)
Pt c/o large gush of fluid, followed by continued leaking.

## 2016-07-05 NOTE — Progress Notes (Signed)
Dr Elon Spanner notified of pt's negative amnisure, contraction pattern and FHR pattern, orders received to discharge home.

## 2016-07-05 NOTE — MAU Provider Note (Signed)
S: Ms. Nancy Arnold is a 26 y.o. G2P0101 at [redacted]w[redacted]d  who presents to MAU today complaining of leaking of fluid since 0400. She denies vaginal bleeding. She denies contractions. She reports normal fetal movement.    O: BP 129/80 (BP Location: Left Arm)   Pulse (!) 105   Temp 98 F (36.7 C) (Oral)   Resp 18   LMP 10/16/2015 (Exact Date)  GENERAL: Well-developed, well-nourished female in no acute distress.  HEAD: Normocephalic, atraumatic.  CHEST: Normal effort of breathing, regular heart rate ABDOMEN: Soft, nontender, gravid PELVIC: Normal external female genitalia. Vagina is pink and rugated. Cervix with normal contour, no lesions. Mucus discharge.  Negative pooling.   Cervical exam:    deferred due to possible ROM and few palpable contractions   Fetal Monitoring: Baseline: 130 bpm Variability: moderate Accelerations: 15 x 15 Decelerations: none Contractions: q 6 minutes   Results for orders placed or performed during the hospital encounter of 07/05/16 (from the past 24 hour(s))  Amnisure rupture of membrane (rom)not at [facility]     Status: None   Collection Time: 07/05/16  7:15 AM  Result Value Ref Range   Amnisure ROM NEGATIVE     MDM Fern inconclusive, Amnisure ordered  Amnisure negative  A: SIUP at [redacted]w[redacted]d  Membranes intact  P: Report given to RN to contact MD on call for further instructions Patient discharged in stable condition. No indication of labor at this time.  Follow-up in the office as scheduled Return to MAU as needed   Marny Lowenstein, PA-C 07/05/2016 7:52 AM

## 2016-07-05 NOTE — MAU Provider Note (Signed)
History   [id number]   Chief Complaint  Patient presents with  . ruptured membranes    HPI Nancy Arnold is a 26 y.o. female  G2P0101 here with report of watery vaginal discharge since this morning. Reports filling up 3 pads today & leaking through her pants. Describes as clear fluid without odor. Some contractions; can't tell frequency & reports they feel like "slight twinge" in her suprapubic area. Denies vaginal bleeding. Positive fetal movement. Was dilated 2 cm in office last week when checked by Dr. Rana Snare.   Patient's last menstrual period was 10/16/2015 (exact date).  OB History  Gravida Para Term Preterm AB Living  2 1 0 1 0 1  SAB TAB Ectopic Multiple Live Births  0 0 0 0 1    # Outcome Date GA Lbr Len/2nd Weight Sex Delivery Anes PTL Lv  2 Current           1 Preterm 07/25/11 [redacted]w[redacted]d 12:30 / 01:20 5 lb 9.6 oz (2.54 kg) M Vag-Spont EPI  LIV      History reviewed. No pertinent past medical history.  Family History  Problem Relation Age of Onset  . Anesthesia problems Other   . Hypertension Father   . Early death Paternal Uncle   . Cancer Paternal Uncle   . Arthritis Maternal Grandmother   . Hearing loss Maternal Grandmother   . Hypertension Maternal Grandmother   . Alcohol abuse Maternal Grandfather   . Arthritis Paternal Grandmother   . Asthma Paternal Grandmother   . COPD Paternal Grandmother   . Hypertension Paternal Grandmother   . Stroke Paternal Grandfather     Social History   Social History  . Marital status: Married    Spouse name: N/A  . Number of children: N/A  . Years of education: N/A   Social History Main Topics  . Smoking status: Never Smoker  . Smokeless tobacco: Never Used  . Alcohol use No  . Drug use: No  . Sexual activity: Yes    Birth control/ protection: None   Other Topics Concern  . None   Social History Narrative  . None    No Known Allergies  No current facility-administered medications on file prior to encounter.     Current Outpatient Prescriptions on File Prior to Encounter  Medication Sig Dispense Refill  . acetaminophen (TYLENOL) 325 MG tablet Take 650 mg by mouth every 6 (six) hours as needed for moderate pain.    . Prenatal Vit-Fe Fumarate-FA (MULTIVITAMIN-PRENATAL) 27-0.8 MG TABS tablet Take 1 tablet by mouth daily at 12 noon.       Review of Systems  Constitutional: Negative.   Gastrointestinal: Positive for abdominal pain.  Genitourinary: Positive for vaginal discharge. Negative for vaginal bleeding.   Physical Exam   Vitals:   07/05/16 1707  Weight: 191 lb (86.6 kg)  Height:  (1.626 m)    Physical Exam  Nursing note and vitals reviewed. Constitutional: She is oriented to person, place, and time. She appears well-developed and well-nourished. No distress.  HENT:  Head: Normocephalic and atraumatic.  Eyes: Conjunctivae are normal. Right eye exhibits no discharge. Left eye exhibits no discharge. No scleral icterus.  Neck: Normal range of motion.  Respiratory: Effort normal. No respiratory distress.  Genitourinary: No bleeding in the vagina.  Genitourinary Comments: Small amount of pooling, clear fluid  Neurological: She is alert and oriented to person, place, and time.  Skin: Skin is warm and dry. She is not diaphoretic.  Psychiatric:  She has a normal mood and affect. Her behavior is normal. Judgment and thought content normal.   Dilation: 2 Effacement (%): 80 Station: -3 Presentation: Vertex Exam by:: Sheryn Bison RN  Fetal Tracing:  Baseline: 140 Variability: moderate Accelerations: 15x15 Decelerations: none  Toco: 3-46mins MAU Course  Procedures Results for orders placed or performed during the hospital encounter of 07/05/16 (from the past 48 hour(s))  Fern Test     Status: None   Collection Time: 07/05/16  4:06 PM  Result Value Ref Range   POCT Fern Test Positive = ruptured amniotic membanes     MDM Reactive NST + pooling & + fern  Assessment and  Plan  A; 1. Amniotic fluid leaking    P; RN to call Dr. Elon Spanner for admission orders   Judeth Horn, NP 07/05/2016 3:46 PM

## 2016-07-05 NOTE — Anesthesia Preprocedure Evaluation (Signed)
Anesthesia Evaluation  Patient identified by MRN, date of birth, ID band Patient awake    Reviewed: Allergy & Precautions, H&P , Patient's Chart, lab work & pertinent test results  Airway Mallampati: II  TM Distance: >3 FB Neck ROM: full    Dental no notable dental hx.    Pulmonary neg pulmonary ROS,    Pulmonary exam normal        Cardiovascular negative cardio ROS Normal cardiovascular exam     Neuro/Psych    GI/Hepatic negative GI ROS, Neg liver ROS,   Endo/Other    Renal/GU negative Renal ROS     Musculoskeletal   Abdominal   Peds  Hematology negative hematology ROS (+)   Anesthesia Other Findings   Reproductive/Obstetrics (+) Pregnancy                             Anesthesia Physical Anesthesia Plan  ASA: II  Anesthesia Plan: Epidural   Post-op Pain Management:    Induction:   Airway Management Planned:   Additional Equipment:   Intra-op Plan:   Post-operative Plan:   Informed Consent: I have reviewed the patients History and Physical, chart, labs and discussed the procedure including the risks, benefits and alternatives for the proposed anesthesia with the patient or authorized representative who has indicated his/her understanding and acceptance.     Plan Discussed with:   Anesthesia Plan Comments:         Anesthesia Quick Evaluation

## 2016-07-05 NOTE — H&P (Signed)
Nancy Arnold is a 26 y.o. female presenting for PROM. OB History    Gravida Para Term Preterm AB Living   2 1 0 1 0 1   SAB TAB Ectopic Multiple Live Births   0 0 0 0 1     History reviewed. No pertinent past medical history. Past Surgical History:  Procedure Laterality Date  . ADENOIDECTOMY    . EYE SURGERY    . EYE SURGERY     age 40  to correct an eye that turned in  . TONSILLECTOMY     Family History: family history includes Alcohol abuse in her maternal grandfather; Anesthesia problems in her other; Arthritis in her maternal grandmother and paternal grandmother; Asthma in her paternal grandmother; COPD in her paternal grandmother; Cancer in her paternal uncle; Early death in her paternal uncle; Hearing loss in her maternal grandmother; Hypertension in her father, maternal grandmother, and paternal grandmother; Stroke in her paternal grandfather. Social History:  reports that she has never smoked. She has never used smokeless tobacco. She reports that she does not drink alcohol or use drugs.     Maternal Diabetes: No Genetic Screening: Normal Maternal Ultrasounds/Referrals: Normal Fetal Ultrasounds or other Referrals:  None Maternal Substance Abuse:  No Significant Maternal Medications:  None Significant Maternal Lab Results:  None Other Comments:  None  ROS History   Last menstrual period 10/16/2015, unknown if currently breastfeeding. Exam Physical Exam  NAD, A&O NWOB Abd soft, nondistended, gravid SVE 2/80/-3, vertex per RN  Prenatal labs: ABO, Rh:   Antibody:   Rubella:   RPR:    HBsAg:    HIV:    GBS:   neg  Assessment/Plan: 26yo G2P0101 @ 37.4wga pw/ PROM. H/o preterm NSVD s/s IOL for PIH (~5#7). Has been on ASA this pregnancies - no BP concerns this pregnancy. This pregnancy result of IUI. H/o appy. Otherwise no sig med/surg concerns.  Plan for pitocin IOL for PROM at early term.  GBS neg Ranae Pila 07/05/2016, 3:29 PM

## 2016-07-05 NOTE — MAU Note (Signed)
EFM readjusted, pt sitting up in bed at times and tracing maternal heart rate

## 2016-07-06 ENCOUNTER — Encounter (HOSPITAL_COMMUNITY): Payer: Self-pay | Admitting: *Deleted

## 2016-07-06 LAB — CBC
HCT: 33.7 % — ABNORMAL LOW (ref 36.0–46.0)
Hemoglobin: 10.5 g/dL — ABNORMAL LOW (ref 12.0–15.0)
MCH: 24.3 pg — ABNORMAL LOW (ref 26.0–34.0)
MCHC: 31.2 g/dL (ref 30.0–36.0)
MCV: 78 fL (ref 78.0–100.0)
PLATELETS: 225 10*3/uL (ref 150–400)
RBC: 4.32 MIL/uL (ref 3.87–5.11)
RDW: 14.4 % (ref 11.5–15.5)
WBC: 20.5 10*3/uL — AB (ref 4.0–10.5)

## 2016-07-06 LAB — RPR: RPR Ser Ql: NONREACTIVE

## 2016-07-06 MED ORDER — ONDANSETRON HCL 4 MG PO TABS: 4.0000 mg | ORAL_TABLET | ORAL | Status: DC | PRN

## 2016-07-06 MED ORDER — IBUPROFEN 600 MG PO TABS
600.0000 mg | ORAL_TABLET | Freq: Four times a day (QID) | ORAL | Status: DC
  Administered 2016-07-06 – 2016-07-07 (×6): 600 mg via ORAL
  Filled 2016-07-06 (×6): qty 1

## 2016-07-06 MED ORDER — TETANUS-DIPHTH-ACELL PERTUSSIS 5-2.5-18.5 LF-MCG/0.5 IM SUSP: 0.5000 mL | Freq: Once | INTRAMUSCULAR | Status: DC

## 2016-07-06 MED ORDER — WITCH HAZEL-GLYCERIN EX PADS: 1.0000 "application " | MEDICATED_PAD | CUTANEOUS | Status: DC | PRN

## 2016-07-06 MED ORDER — OXYCODONE HCL 5 MG PO TABS: 10.0000 mg | ORAL_TABLET | ORAL | Status: DC | PRN

## 2016-07-06 MED ORDER — OXYCODONE HCL 5 MG PO TABS: 5.0000 mg | ORAL_TABLET | ORAL | Status: DC | PRN

## 2016-07-06 MED ORDER — SIMETHICONE 80 MG PO CHEW: 80.0000 mg | CHEWABLE_TABLET | ORAL | Status: DC | PRN

## 2016-07-06 MED ORDER — PRENATAL MULTIVITAMIN CH
1.0000 | ORAL_TABLET | Freq: Every day | ORAL | Status: DC
  Administered 2016-07-06 – 2016-07-07 (×2): 1 via ORAL
  Filled 2016-07-06 (×2): qty 1

## 2016-07-06 MED ORDER — ONDANSETRON HCL 4 MG/2ML IJ SOLN: 4.0000 mg | INTRAMUSCULAR | Status: DC | PRN

## 2016-07-06 MED ORDER — ZOLPIDEM TARTRATE 5 MG PO TABS: 5.0000 mg | ORAL_TABLET | Freq: Every evening | ORAL | Status: DC | PRN

## 2016-07-06 MED ORDER — BENZOCAINE-MENTHOL 20-0.5 % EX AERO: 1.0000 "application " | INHALATION_SPRAY | CUTANEOUS | Status: DC | PRN

## 2016-07-06 MED ORDER — DIPHENHYDRAMINE HCL 25 MG PO CAPS
25.0000 mg | ORAL_CAPSULE | Freq: Four times a day (QID) | ORAL | Status: DC | PRN
  Administered 2016-07-06: 25 mg via ORAL
  Filled 2016-07-06: qty 1

## 2016-07-06 MED ORDER — COCONUT OIL OIL: 1.0000 "application " | TOPICAL_OIL | Status: DC | PRN

## 2016-07-06 MED ORDER — ACETAMINOPHEN 325 MG PO TABS: 650.0000 mg | ORAL_TABLET | ORAL | Status: DC | PRN

## 2016-07-06 MED ORDER — DIBUCAINE 1 % RE OINT: 1.0000 "application " | TOPICAL_OINTMENT | RECTAL | Status: DC | PRN

## 2016-07-06 MED ORDER — SENNOSIDES-DOCUSATE SODIUM 8.6-50 MG PO TABS
2.0000 | ORAL_TABLET | ORAL | Status: DC
  Administered 2016-07-07: 2 via ORAL
  Filled 2016-07-06: qty 2

## 2016-07-06 NOTE — Lactation Note (Signed)
This note was copied from a baby's chart. Lactation Consultation Note  Patient Name: Nancy Arnold Today's Date: 07/06/2016 Reason for consult: Initial assessment Initial visit at 14 hours of age.  Mom reports baby has tried to latch a few times for only a few sucks and has not had a feeding yet.  Baby actively sucking on paci in moms arms.   Lc offered to assist with latching.  LC educated on paci use. Educated parents on feeding cues. Mom has flat nipples with semi compressible tissue. Mom has shells not in use yet.  Mom is able to hand express a few drops of colostrum from right breast.  Mom prefers football hold.  Lc placed baby STS with mom. Baby is eager and has few strong sucks and then released latch. Mom denies pain with sucking bursts.  Baby does not maintain feeding well and is sleepy with attemtps. Lc assisted with hand expression and spoon feeding baby about of EBM.  Baby extends tongue to spoon with cleft of tongue tip noted.   Colostrum container given to mom and encouraged to hand express to increase her milk volume and feed baby if baby is not latching or maintaining latch well.   [facility] LC resources given and discussed.  Encouraged to feed with early cues on demand.  Early newborn behavior discussed.  FOB at bedside supportive.   Mom to call for assist as needed.    Maternal Data Has patient been taught Hand Expression?: Yes Does the patient have breastfeeding experience prior to this delivery?: Yes  Feeding Feeding Type: Breast Fed Length of feed: 2 min  LATCH Score/Interventions Latch: Repeated attempts needed to sustain latch, nipple held in mouth throughout feeding, stimulation needed to elicit sucking reflex. Intervention(s): Adjust position;Assist with latch;Breast massage;Breast compression  Audible Swallowing: None  Type of Nipple: Flat  Comfort (Breast/Nipple): Soft / non-tender     Hold (Positioning): Assistance needed to correctly position infant  at breast and maintain latch. Intervention(s): Breastfeeding basics reviewed;Support Pillows;Position options;Skin to skin  LATCH Score: 5  Lactation Tools Discussed/Used     Consult Status Consult Status: Follow-up Date: 07/07/16 Follow-up type: In-patient    [name] 07/06/2016, 4:43 PM

## 2016-07-06 NOTE — Anesthesia Postprocedure Evaluation (Signed)
Anesthesia Post Note  Patient: Nancy Arnold  Procedure(s) Performed: * No procedures listed *  Patient location during evaluation: Mother Baby Anesthesia Type: Epidural Level of consciousness: awake and alert, oriented and patient cooperative Pain management: pain level controlled Vital Signs Assessment: post-procedure vital signs reviewed and stable Respiratory status: spontaneous breathing Cardiovascular status: stable Postop Assessment: no headache, epidural receding, patient able to bend at knees and no signs of nausea or vomiting Anesthetic complications: no Comments: Pain Score 0.        Last Vitals:  Vitals:   07/06/16 0410 07/06/16 0515  BP: 119/75 117/73  Pulse: 96 92  Resp: 18 18  Temp: 37 C 36.9 C    Last Pain:  Vitals:   07/06/16 0515  TempSrc: Oral  PainSc: 0-No pain   Pain Goal: Patients Stated Pain Goal: 4 (07/05/16 1713)               Merrilyn Puma

## 2016-07-07 MED ORDER — IBUPROFEN 600 MG PO TABS: 600.0000 mg | ORAL_TABLET | Freq: Four times a day (QID) | ORAL | 1 refills | Status: DC

## 2016-07-07 NOTE — Progress Notes (Signed)
Post Partum Day 1 Subjective: no complaints, up ad lib, voiding, tolerating PO, + flatus and desires early discharge  Objective: Blood pressure 115/68, pulse 75, temperature 97.9 F (36.6 C), resp. rate 20, height  (1.626 m), weight 191 lb (86.6 kg), last menstrual period 10/16/2015, SpO2 98 %, unknown if currently breastfeeding.  Physical Exam:  General: alert and cooperative Lochia: appropriate Uterine Fundus: firm Incision: healing well DVT Evaluation: No evidence of DVT seen on physical exam. Negative Homan's sign. No cords or calf tenderness. No significant calf/ankle edema.   Recent Labs  07/05/16 1706 07/06/16 0630  HGB 10.4* 10.5*  HCT 32.6* 33.7*    Assessment/Plan: Discharge home and Breastfeeding   LOS: 2 days   Tineka Uriegas G 07/08/2016, 8:34 AM

## 2016-07-07 NOTE — Lactation Note (Signed)
This note was copied from a baby's chart. Lactation Consultation Note  Patient Name: Nancy Arnold Today's Date: 07/07/2016 Reason for consult: Follow-up assessment   Follow up with mom of 33 hour old infant. Infant with 1 BF for 15 minutes, EBM x 4 of 2-5 cc, formula x 1 of 8 cc, 5 voids, 4 stools and 1 large curdled formula emesis in the 24 hours preceding this assessment. Infant weight 7 lb 1.9 oz with weight loss of 5% since birth. LATCH Score 5 since birth.   Infant was circumcised with am. He is currently asleep in mom's arms. Offered to assist mom with latch before d/c, mom reports she is planning to pump and bottle feed infant. She reports she may latch infant at home. Enc her to latch prior to pumping or offering bottle. Reviewed supply and demand, colostrum, milk coming to volume, I/O, Engorgement prevention/treatment, and breast milk handling and storage.   Mom asked questions in regards to pumping using Spectra 2, questions answered. Informed mom that [facility] does have a LC in their office. Reviewed OP services, BF Support groups and LC phone #. Enc mom to call with questions/concerns prn.    Maternal Data Formula Feeding for Exclusion: Yes Reason for exclusion: Mother's choice to formula and breast feed on admission Has patient been taught Hand Expression?: Yes Does the patient have breastfeeding experience prior to this delivery?: Yes  Feeding Feeding Type: Breast Milk  LATCH Score/Interventions                      Lactation Tools Discussed/Used WIC Program: No Pump Review: Setup, frequency, and cleaning;Milk Storage Initiated by:: Reviewed   Consult Status Consult Status: Complete Follow-up type: Call as needed    Ed Blalock 07/07/2016, 11:03 AM

## 2016-07-07 NOTE — Discharge Summary (Signed)
Obstetric Discharge Summary Reason for Admission: rupture of membranes Prenatal Procedures: ultrasound Intrapartum Procedures: spontaneous vaginal delivery Postpartum Procedures: none Complications-Operative and Postpartum: none Hemoglobin  Date Value Ref Range Status  07/06/2016 10.5 (L) 12.0 - 15.0 g/dL Final   HCT  Date Value Ref Range Status  07/06/2016 33.7 (L) 36.0 - 46.0 % Final   Hematocrit  Date Value Ref Range Status  04/17/2016 36.8 34.0 - 46.6 % Final    Physical Exam:  General: alert and cooperative Lochia: appropriate Uterine Fundus: firm Incision: perineum intact DVT Evaluation: No evidence of DVT seen on physical exam. Negative Homan's sign. No cords or calf tenderness. No significant calf/ankle edema.  Discharge Diagnoses: Term Pregnancy-delivered  Discharge Information: Date: 07/07/2016 Activity: pelvic rest Diet: routine Medications: PNV and Ibuprofen Condition: stable Instructions: refer to practice specific booklet Discharge to: home   Newborn Data: Live born female  Birth Weight: 7 lb 7.6 oz (3390 g) APGAR: 9, 9  Home with mother.  Senita Corredor G 07/07/2016, 9:04 AM

## 2017-03-04 ENCOUNTER — Ambulatory Visit: Payer: 59 | Admitting: Family Medicine

## 2017-03-04 ENCOUNTER — Encounter: Payer: Self-pay | Admitting: Family Medicine

## 2017-03-04 VITALS — BP 116/79 | HR 95 | Temp 97.7°F | Ht 64.0 in | Wt 159.0 lb

## 2017-03-04 DIAGNOSIS — J069 Acute upper respiratory infection, unspecified: Secondary | ICD-10-CM | POA: Diagnosis not present

## 2017-03-04 DIAGNOSIS — J0100 Acute maxillary sinusitis, unspecified: Secondary | ICD-10-CM | POA: Diagnosis not present

## 2017-03-04 MED ORDER — NAPROXEN 500 MG PO TABS
500.0000 mg | ORAL_TABLET | Freq: Two times a day (BID) | ORAL | 0 refills | Status: DC
Start: 2017-03-04 — End: 2017-04-17

## 2017-03-04 NOTE — Progress Notes (Signed)
Subjective: ZO:XWRUEAVCC:concern for sinus infection PCP: Remus LofflerJones, Angel S, PA-C WUJ:[id number]:Nancy Arnold is a 26 y.o. female, Who is accompanied by her husband to today's appointment. She is presenting to clinic today for:  1. Sinus symptoms  Patient reports sinus pressure, nonproductive cough, left ear pressure that started Sunday.  She reports associated nasal congestion and rhinorrhea.  She reports multiple sick contacts at home with similar symptoms.  Denies hemoptysis, SOB, dizziness, rash, nausea, vomiting, diarrhea, fevers, chills, myalgia, recent travel.  Patient has used Tylenol with little relief of symptoms.  Denies history of COPD or asthma.  No tobacco use/ exposure.  She is not breast-feeding.  ROS: Per HPI  No Known Allergies No past medical history on file.  Current Outpatient Medications:  .  acetaminophen (TYLENOL) 325 MG tablet, Take 650 mg by mouth every 6 (six) hours as needed for moderate pain., Disp: , Rfl:  .  flintstones complete (FLINTSTONES) 60 MG chewable tablet, Chew 2 tablets by mouth daily., Disp: , Rfl:  .  ibuprofen (ADVIL,MOTRIN) 600 MG tablet, Take 1 tablet (600 mg total) by mouth every 6 (six) hours., Disp: 30 tablet, Rfl: 1 Social History   Socioeconomic History  . Marital status: Married    Spouse name: Not on file  . Number of children: Not on file  . Years of education: Not on file  . Highest education level: Not on file  Social Needs  . Financial resource strain: Not on file  . Food insecurity - worry: Not on file  . Food insecurity - inability: Not on file  . Transportation needs - medical: Not on file  . Transportation needs - non-medical: Not on file  Occupational History  . Not on file  Tobacco Use  . Smoking status: Never Smoker  . Smokeless tobacco: Never Used  Substance and Sexual Activity  . Alcohol use: No  . Drug use: No  . Sexual activity: Yes    Birth control/protection: None  Other Topics Concern  . Not on file  Social History  Narrative  . Not on file   Family History  Problem Relation Age of Onset  . Anesthesia problems Other   . Hypertension Father   . Early death Paternal Uncle   . Cancer Paternal Uncle   . Arthritis Maternal Grandmother   . Hearing loss Maternal Grandmother   . Hypertension Maternal Grandmother   . Alcohol abuse Maternal Grandfather   . Arthritis Paternal Grandmother   . Asthma Paternal Grandmother   . COPD Paternal Grandmother   . Hypertension Paternal Grandmother   . Stroke Paternal Grandfather     Objective: Office vital signs reviewed. BP 116/79   Pulse 95   Temp 97.7 F (36.5 C) (Oral)   Ht 5\' 4"  (1.626 m)   Wt 159 lb (72.1 kg)   SpO2 94%   BMI 27.29 kg/m   Physical Examination:  General: Awake, alert, well nourished, well appearing female, No acute distress HEENT: + maxillary sinus ttp    Neck: No masses palpated. No lymphadenopathy    Ears: Tympanic membranes intact, normal light reflex, no erythema, slight bulging of Left TM. No purulence appreciated.    Eyes: PERRLA, extraocular membranes intact, sclera white, no ocular discharge    Nose: nasal turbinates moist, clear nasal discharge    Throat: moist mucus membranes, no erythema, no tonsillar exudate.  Airway is patent Cardio: regular rate and rhythm, S1S2 heard, no murmurs appreciated Pulm: clear to auscultation bilaterally, no wheezes, rhonchi or  rales; normal work of breathing on room air  Assessment/ Plan: 26 y.o. female   1. URI with cough and congestion Patient is afebrile, well-appearing with normal vital signs.  There is no evidence of bacterial infection on today's exam.  Physical exam was significant for mild maxillary sinus tenderness to palpation slight bulging of the left tympanic membrane.  I recommended sinus irrigation, nasal saline spray, Afrin nasal spray as needed nasal congestion at nighttime and naproxen twice daily as needed fevers or aches.  Home care instructions were reviewed with the  patient and a handout was provided. Strict return precautions and reasons for emergent evaluation in the emergency department review with patient.  They voiced understanding and will follow-up as needed.  2. Acute non-recurrent maxillary sinusitis As above.  Meds ordered this encounter  Medications  . naproxen (NAPROSYN) 500 MG tablet    Sig: Take 1 tablet (500 mg total) by mouth 2 (two) times daily with a meal.    Dispense:  30 tablet    Refill:  0     Ebrahim Deremer Hulen SkainsM Keanen Dohse, DO [facility] [phone]

## 2017-03-04 NOTE — Patient Instructions (Signed)
There is nothing on her exam to suggest a bacterial sinusitis.  I suspect that your symptoms are likely related to a virus.  No antibiotic is needed at this time.  I have prescribed you naproxen 500 mg take up to twice daily if needed for body aches or fever.  To make sure to take this with a meal and avoid other NSAID medications like naproxen, ibuprofen while taking this medication.  I recommend that you consider using a Nettie pot.  Most importantly, they make sure that you are using distilled water and breathing through your mouth while irrigating your sinuses.  You may use Afrin nasal spray at nighttime if needed for nasal congestion.  If you develop any other worrisome symptoms or signs that we discussed, please call me.  Cold symptoms can last up to 2 weeks.    - Get plenty of rest and drink plenty of fluids. - Try to breathe moist air. Use a cold mist humidifier. - Consume warm fluids (soup or tea) to provide relief for a stuffy nose and to loosen phlegm. - For nasal stuffiness, try saline nasal spray or a Neti Pot. Afrin nasal spray can also be used but this product should not be used longer than 3 days or it will cause rebound nasal stuffiness (worsening nasal congestion). - For sore throat pain relief: suck on throat lozenges, hard candy or popsicles; gargle with warm salt water (1/4 tsp. salt per 8 oz. of water); and eat soft, bland foods. - Eat a well-balanced diet. If you cannot, ensure you are getting enough nutrients by taking a daily multivitamin. - Avoid dairy products, as they can thicken phlegm. - Avoid alcohol, as it impairs your body's immune system.  CONTACT YOUR DOCTOR IF YOU EXPERIENCE ANY OF THE FOLLOWING: - High fever - Ear pain - Sinus-type headache - Unusually severe cold symptoms - Cough that gets worse while other cold symptoms improve - Flare up of any chronic lung problem, such as asthma - Your symptoms persist longer than 2 weeks  Sinusitis, Adult Sinusitis is  soreness and inflammation of your sinuses. Sinuses are hollow spaces in the bones around your face. They are located:  Around your eyes.  In the middle of your forehead.  Behind your nose.  In your cheekbones.  Your sinuses and nasal passages are lined with a stringy fluid (mucus). Mucus normally drains out of your sinuses. When your nasal tissues get inflamed or swollen, the mucus can get trapped or blocked so air cannot flow through your sinuses. This lets bacteria, viruses, and funguses grow, and that leads to infection. Follow these instructions at home: Medicines  Take, use, or apply over-the-counter and prescription medicines only as told by your doctor. These may include nasal sprays.  If you were prescribed an antibiotic medicine, take it as told by your doctor. Do not stop taking the antibiotic even if you start to feel better. Hydrate and Humidify  Drink enough water to keep your pee (urine) clear or pale yellow.  Use a cool mist humidifier to keep the humidity level in your home above 50%.  Breathe in steam for 10-15 minutes, 3-4 times a day or as told by your doctor. You can do this in the bathroom while a hot shower is running.  Try not to spend time in cool or dry air. Rest  Rest as much as possible.  Sleep with your head raised (elevated).  Make sure to get enough sleep each night. General instructions  Put a warm, moist washcloth on your face 3-4 times a day or as told by your doctor. This will help with discomfort.  Wash your hands often with soap and water. If there is no soap and water, use hand sanitizer.  Do not smoke. Avoid being around people who are smoking (secondhand smoke).  Keep all follow-up visits as told by your doctor. This is important. Contact a doctor if:  You have a fever.  Your symptoms get worse.  Your symptoms do not get better within 10 days. Get help right away if:  You have a very bad headache.  You cannot stop throwing up  (vomiting).  You have pain or swelling around your face or eyes.  You have trouble seeing.  You feel confused.  Your neck is stiff.  You have trouble breathing. This information is not intended to replace advice given to you by your health care provider. Make sure you discuss any questions you have with your health care provider. Document Released: 08/27/2007 Document Revised: 11/04/2015 Document Reviewed: 01/03/2015 Elsevier Interactive Patient Education  [employer]2018 [employer]

## 2017-03-31 ENCOUNTER — Ambulatory Visit (INDEPENDENT_AMBULATORY_CARE_PROVIDER_SITE_OTHER): Payer: 59 | Admitting: *Deleted

## 2017-03-31 DIAGNOSIS — Z23 Encounter for immunization: Secondary | ICD-10-CM

## 2017-04-15 ENCOUNTER — Encounter: Payer: 59 | Admitting: Physician Assistant

## 2017-04-17 ENCOUNTER — Encounter: Payer: Self-pay | Admitting: Physician Assistant

## 2017-04-17 ENCOUNTER — Ambulatory Visit (INDEPENDENT_AMBULATORY_CARE_PROVIDER_SITE_OTHER): Payer: 59 | Admitting: Physician Assistant

## 2017-04-17 VITALS — BP 111/78 | HR 81 | Temp 98.1°F | Ht 64.0 in | Wt 154.8 lb

## 2017-04-17 DIAGNOSIS — Z0000 Encounter for general adult medical examination without abnormal findings: Secondary | ICD-10-CM | POA: Diagnosis not present

## 2017-04-17 NOTE — Patient Instructions (Signed)
In a few days you may receive a survey in the mail or online from [employer] regarding your visit with us today. Please take a moment to fill this out. Your feedback is very important to our whole office. It can help us better understand your needs as well as improve your experience and satisfaction. Thank you for taking your time to complete it. We care about you.  Tristan Bramble, PA-C  

## 2017-04-17 NOTE — Progress Notes (Signed)
BP 111/78   Pulse 81   Temp 98.1 F (36.7 C) (Oral)   Ht '5\' 4"'  (1.626 m)   Wt 154 lb 12.8 oz (70.2 kg)   BMI 26.57 kg/m    Subjective:    Patient ID: Nancy Arnold, female    DOB: 1990/09/26, 27 y.o.   MRN: [id number]  HPI: Nancy Arnold is a 27 y.o. female presenting on 04/17/2017 for Annual Exam  This patient comes in for annual well physical examination. All medications are reviewed today. There are no reports of any problems with the medications. All of the medical conditions are reviewed and updated.  Lab work is reviewed and will be ordered as medically necessary. There are no new problems reported with today's visit.  Patient reports doing well overall.   Relevant past medical, surgical, family and social history reviewed and updated as indicated. Allergies and medications reviewed and updated.  History reviewed. No pertinent past medical history.  Past Surgical History:  Procedure Laterality Date  . ADENOIDECTOMY    . EYE SURGERY    . EYE SURGERY     age 88  to correct an eye that turned in  . TONSILLECTOMY      Review of Systems  Constitutional: Negative.  Negative for activity change, fatigue and fever.  HENT: Negative.   Eyes: Negative.   Respiratory: Negative.  Negative for cough.   Cardiovascular: Negative.  Negative for chest pain.  Gastrointestinal: Negative.  Negative for abdominal pain.  Endocrine: Negative.   Genitourinary: Negative.  Negative for dysuria.  Musculoskeletal: Negative.   Skin: Negative.   Neurological: Negative.     Allergies as of 04/17/2017   No Known Allergies     Medication List    as of 04/17/2017 12:44 PM   You have not been prescribed any medications.        Objective:    BP 111/78   Pulse 81   Temp 98.1 F (36.7 C) (Oral)   Ht '5\' 4"'  (1.626 m)   Wt 154 lb 12.8 oz (70.2 kg)   BMI 26.57 kg/m   No Known Allergies  Physical Exam  Constitutional: She is oriented to person, place, and time. She appears  well-developed and well-nourished.  HENT:  Head: Normocephalic and atraumatic.  Right Ear: Tympanic membrane, external ear and ear canal normal.  Left Ear: Tympanic membrane, external ear and ear canal normal.  Nose: Nose normal. No rhinorrhea.  Mouth/Throat: Oropharynx is clear and moist and mucous membranes are normal. No oropharyngeal exudate or posterior oropharyngeal erythema.  Eyes: Conjunctivae and EOM are normal. Pupils are equal, round, and reactive to light.  Neck: Normal range of motion. Neck supple.  Cardiovascular: Normal rate, regular rhythm, normal heart sounds and intact distal pulses.  Pulmonary/Chest: Effort normal and breath sounds normal.  Abdominal: Soft. Bowel sounds are normal.  Neurological: She is alert and oriented to person, place, and time. She has normal reflexes.  Skin: Skin is warm and dry. No rash noted.  Psychiatric: She has a normal mood and affect. Her behavior is normal. Judgment and thought content normal.  Nursing note and vitals reviewed.       Assessment & Plan:   1. Well adult exam - CBC with Differential/Platelet - CMP14+EGFR - Lipid panel - TSH   No current outpatient medications on file. Continue all other maintenance medications as listed above.  Follow up plan: Return in about 1 year (around 04/17/2018), or if symptoms worsen or fail to  improve.  Educational handout given for [place] PA-C [place] [address]  [place], [place] [address] [phone]   04/17/2017, 12:44 PM

## 2017-04-18 LAB — CMP14+EGFR
ALK PHOS: 97 IU/L (ref 39–117)
ALT: 10 IU/L (ref 0–32)
AST: 16 IU/L (ref 0–40)
Albumin/Globulin Ratio: 2 (ref 1.2–2.2)
Albumin: 4.5 g/dL (ref 3.5–5.5)
BUN/Creatinine Ratio: 14 (ref 9–23)
BUN: 10 mg/dL (ref 6–20)
Bilirubin Total: 0.5 mg/dL (ref 0.0–1.2)
CALCIUM: 9.3 mg/dL (ref 8.7–10.2)
CO2: 24 mmol/L (ref 20–29)
CREATININE: 0.71 mg/dL (ref 0.57–1.00)
Chloride: 102 mmol/L (ref 96–106)
GFR calc Af Amer: 138 mL/min/{1.73_m2} (ref >59)
GFR calc non Af Amer: 120 mL/min/{1.73_m2} (ref >59)
GLOBULIN, TOTAL: 2.2 g/dL (ref 1.5–4.5)
GLUCOSE: 78 mg/dL (ref 65–99)
Potassium: 4.2 mmol/L (ref 3.5–5.2)
SODIUM: 142 mmol/L (ref 134–144)
Total Protein: 6.7 g/dL (ref 6.0–8.5)

## 2017-04-18 LAB — CBC WITH DIFFERENTIAL/PLATELET
BASOS ABS: 0 10*3/uL (ref 0.0–0.2)
Basos: 0 %
EOS (ABSOLUTE): 0.2 10*3/uL (ref 0.0–0.4)
Eos: 2 %
Hematocrit: 40.1 % (ref 34.0–46.6)
Hemoglobin: 13.7 g/dL (ref 11.1–15.9)
IMMATURE GRANULOCYTES: 0 %
Immature Grans (Abs): 0 10*3/uL (ref 0.0–0.1)
LYMPHS ABS: 1.5 10*3/uL (ref 0.7–3.1)
Lymphs: 23 %
MCH: 27.3 pg (ref 26.6–33.0)
MCHC: 34.2 g/dL (ref 31.5–35.7)
MCV: 80 fL (ref 79–97)
MONOS ABS: 0.4 10*3/uL (ref 0.1–0.9)
Monocytes: 6 %
NEUTROS PCT: 69 %
Neutrophils Absolute: 4.5 10*3/uL (ref 1.4–7.0)
PLATELETS: 263 10*3/uL (ref 150–379)
RBC: 5.02 x10E6/uL (ref 3.77–5.28)
RDW: 14.4 % (ref 12.3–15.4)
WBC: 6.6 10*3/uL (ref 3.4–10.8)

## 2017-04-18 LAB — LIPID PANEL
CHOLESTEROL TOTAL: 145 mg/dL (ref 100–199)
Chol/HDL Ratio: 3.3 ratio (ref 0.0–4.4)
HDL: 44 mg/dL (ref >39)
LDL Calculated: 89 mg/dL (ref 0–99)
Triglycerides: 62 mg/dL (ref 0–149)
VLDL CHOLESTEROL CAL: 12 mg/dL (ref 5–40)

## 2017-04-18 LAB — TSH: TSH: 0.924 u[IU]/mL (ref 0.450–4.500)

## 2017-08-01 DIAGNOSIS — K644 Residual hemorrhoidal skin tags: Secondary | ICD-10-CM | POA: Diagnosis not present

## 2017-08-31 DIAGNOSIS — Z01419 Encounter for gynecological examination (general) (routine) without abnormal findings: Secondary | ICD-10-CM | POA: Diagnosis not present

## 2017-08-31 DIAGNOSIS — Z6822 Body mass index (BMI) 22.0-22.9, adult: Secondary | ICD-10-CM | POA: Diagnosis not present

## 2018-01-22 ENCOUNTER — Ambulatory Visit (INDEPENDENT_AMBULATORY_CARE_PROVIDER_SITE_OTHER): Payer: 59 | Admitting: *Deleted

## 2018-01-22 DIAGNOSIS — Z23 Encounter for immunization: Secondary | ICD-10-CM | POA: Diagnosis not present

## 2018-04-14 ENCOUNTER — Ambulatory Visit (INDEPENDENT_AMBULATORY_CARE_PROVIDER_SITE_OTHER): Payer: 59 | Admitting: Physician Assistant

## 2018-04-14 ENCOUNTER — Encounter: Payer: Self-pay | Admitting: Physician Assistant

## 2018-04-14 VITALS — BP 114/75 | HR 96 | Temp 97.9°F | Ht 62.5 in | Wt 123.0 lb

## 2018-04-14 DIAGNOSIS — Z0000 Encounter for general adult medical examination without abnormal findings: Secondary | ICD-10-CM

## 2018-04-15 ENCOUNTER — Encounter: Payer: Self-pay | Admitting: Physician Assistant

## 2018-04-15 LAB — CBC WITH DIFFERENTIAL/PLATELET
BASOS ABS: 0 10*3/uL (ref 0.0–0.2)
Basos: 1 %
EOS (ABSOLUTE): 0.1 10*3/uL (ref 0.0–0.4)
Eos: 2 %
Hematocrit: 45.3 % (ref 34.0–46.6)
Hemoglobin: 15.6 g/dL (ref 11.1–15.9)
Immature Grans (Abs): 0 10*3/uL (ref 0.0–0.1)
Immature Granulocytes: 0 %
LYMPHS ABS: 1.2 10*3/uL (ref 0.7–3.1)
Lymphs: 20 %
MCH: 29.5 pg (ref 26.6–33.0)
MCHC: 34.4 g/dL (ref 31.5–35.7)
MCV: 86 fL (ref 79–97)
MONOS ABS: 0.4 10*3/uL (ref 0.1–0.9)
Monocytes: 7 %
NEUTROS ABS: 4.2 10*3/uL (ref 1.4–7.0)
Neutrophils: 70 %
Platelets: 218 10*3/uL (ref 150–450)
RBC: 5.28 x10E6/uL (ref 3.77–5.28)
RDW: 12.7 % (ref 11.7–15.4)
WBC: 6 10*3/uL (ref 3.4–10.8)

## 2018-04-15 LAB — LIPID PANEL
Chol/HDL Ratio: 3 ratio (ref 0.0–4.4)
Cholesterol, Total: 213 mg/dL — ABNORMAL HIGH (ref 100–199)
HDL: 71 mg/dL
LDL Calculated: 134 mg/dL — ABNORMAL HIGH (ref 0–99)
Triglycerides: 42 mg/dL (ref 0–149)
VLDL Cholesterol Cal: 8 mg/dL (ref 5–40)

## 2018-04-15 LAB — CMP14+EGFR
ALT: 17 IU/L (ref 0–32)
AST: 19 IU/L (ref 0–40)
Albumin/Globulin Ratio: 2.1 (ref 1.2–2.2)
Albumin: 4.7 g/dL (ref 3.9–5.0)
Alkaline Phosphatase: 62 IU/L (ref 39–117)
BUN/Creatinine Ratio: 21 (ref 9–23)
BUN: 14 mg/dL (ref 6–20)
Bilirubin Total: 0.5 mg/dL (ref 0.0–1.2)
CO2: 24 mmol/L (ref 20–29)
Calcium: 9.5 mg/dL (ref 8.7–10.2)
Chloride: 100 mmol/L (ref 96–106)
Creatinine, Ser: 0.68 mg/dL (ref 0.57–1.00)
GFR calc Af Amer: 141 mL/min/1.73
GFR calc non Af Amer: 122 mL/min/1.73
Globulin, Total: 2.2 g/dL (ref 1.5–4.5)
Glucose: 82 mg/dL (ref 65–99)
Potassium: 4.5 mmol/L (ref 3.5–5.2)
Sodium: 140 mmol/L (ref 134–144)
Total Protein: 6.9 g/dL (ref 6.0–8.5)

## 2018-04-15 LAB — TSH: TSH: 1.37 u[IU]/mL (ref 0.450–4.500)

## 2018-04-15 NOTE — Progress Notes (Signed)
BP 114/75   Pulse 96   Temp 97.9 F (36.6 C) (Oral)   Ht 5' 2.5" (1.588 m)   Wt 123 lb (55.8 kg)   BMI 22.14 kg/m    Subjective:    Patient ID: Nancy Arnold, female    DOB: April 18, 1990, 28 y.o.   MRN: [id number]  HPI: Nancy Arnold is a 28 y.o. female presenting on 04/14/2018 for Annual Exam  This patient comes in for annual well physical examination. All medications are reviewed today. There are no reports of any problems with the medications. All of the medical conditions are reviewed and updated.  Lab work is reviewed and will be ordered as medically necessary. There are no new problems reported with today's visit.  Patient reports doing well overall.   No past medical history on file. Relevant past medical, surgical, family and social history reviewed and updated as indicated. Interim medical history since our last visit reviewed. Allergies and medications reviewed and updated. DATA REVIEWED: CHART IN EPIC  Family History reviewed for pertinent findings.  Review of Systems  Constitutional: Negative.  Negative for activity change, fatigue and fever.  HENT: Negative.   Eyes: Negative.   Respiratory: Negative.  Negative for cough.   Cardiovascular: Negative.  Negative for chest pain.  Gastrointestinal: Negative.  Negative for abdominal pain.  Endocrine: Negative.   Genitourinary: Negative.  Negative for dysuria.  Musculoskeletal: Negative.   Skin: Negative.   Neurological: Negative.     Allergies as of 04/14/2018   No Known Allergies     Medication List    as of April 14, 2018 11:59 PM   You have not been prescribed any medications.        Objective:    BP 114/75   Pulse 96   Temp 97.9 F (36.6 C) (Oral)   Ht 5' 2.5" (1.588 m)   Wt 123 lb (55.8 kg)   BMI 22.14 kg/m   No Known Allergies  Wt Readings from Last 3 Encounters:  04/14/18 123 lb (55.8 kg)  04/17/17 154 lb 12.8 oz (70.2 kg)  03/04/17 159 lb (72.1 kg)    Physical  Exam Constitutional:      Appearance: She is well-developed.  HENT:     Head: Normocephalic and atraumatic.     Right Ear: Tympanic membrane, ear canal and external ear normal.     Left Ear: Tympanic membrane, ear canal and external ear normal.     Nose: Nose normal. No rhinorrhea.     Mouth/Throat:     Pharynx: No oropharyngeal exudate or posterior oropharyngeal erythema.  Eyes:     Conjunctiva/sclera: Conjunctivae normal.     Pupils: Pupils are equal, round, and reactive to light.  Neck:     Musculoskeletal: Normal range of motion and neck supple.  Cardiovascular:     Rate and Rhythm: Normal rate and regular rhythm.     Heart sounds: Normal heart sounds.  Pulmonary:     Effort: Pulmonary effort is normal.     Breath sounds: Normal breath sounds.  Abdominal:     General: Bowel sounds are normal.     Palpations: Abdomen is soft.  Skin:    General: Skin is warm and dry.     Findings: No rash.  Neurological:     Mental Status: She is alert and oriented to person, place, and time.     Deep Tendon Reflexes: Reflexes are normal and symmetric.  Psychiatric:  Behavior: Behavior normal.        Thought Content: Thought content normal.        Judgment: Judgment normal.     Results for orders placed or performed in visit on 04/14/18  CBC with Differential/Platelet  Result Value Ref Range   WBC 6.0 3.4 - 10.8 x10E3/uL   RBC 5.28 3.77 - 5.28 x10E6/uL   Hemoglobin 15.6 11.1 - 15.9 g/dL   Hematocrit 45.3 34.0 - 46.6 %   MCV 86 79 - 97 fL   MCH 29.5 26.6 - 33.0 pg   MCHC 34.4 31.5 - 35.7 g/dL   RDW 12.7 11.7 - 15.4 %   Platelets 218 150 - 450 x10E3/uL   Neutrophils 70 Not Estab. %   Lymphs 20 Not Estab. %   Monocytes 7 Not Estab. %   Eos 2 Not Estab. %   Basos 1 Not Estab. %   Neutrophils Absolute 4.2 1.4 - 7.0 x10E3/uL   Lymphocytes Absolute 1.2 0.7 - 3.1 x10E3/uL   Monocytes Absolute 0.4 0.1 - 0.9 x10E3/uL   EOS (ABSOLUTE) 0.1 0.0 - 0.4 x10E3/uL   Basophils Absolute  0.0 0.0 - 0.2 x10E3/uL   Immature Granulocytes 0 Not Estab. %   Immature Grans (Abs) 0.0 0.0 - 0.1 x10E3/uL  CMP14+EGFR  Result Value Ref Range   Glucose 82 65 - 99 mg/dL   BUN 14 6 - 20 mg/dL   Creatinine, Ser 0.68 0.57 - 1.00 mg/dL   GFR calc non Af Amer 122 >59 mL/min/1.73   GFR calc Af Amer 141 >59 mL/min/1.73   BUN/Creatinine Ratio 21 9 - 23   Sodium 140 134 - 144 mmol/L   Potassium 4.5 3.5 - 5.2 mmol/L   Chloride 100 96 - 106 mmol/L   CO2 24 20 - 29 mmol/L   Calcium 9.5 8.7 - 10.2 mg/dL   Total Protein 6.9 6.0 - 8.5 g/dL   Albumin 4.7 3.9 - 5.0 g/dL   Globulin, Total 2.2 1.5 - 4.5 g/dL   Albumin/Globulin Ratio 2.1 1.2 - 2.2   Bilirubin Total 0.5 0.0 - 1.2 mg/dL   Alkaline Phosphatase 62 39 - 117 IU/L   AST 19 0 - 40 IU/L   ALT 17 0 - 32 IU/L  Lipid panel  Result Value Ref Range   Cholesterol, Total 213 (H) 100 - 199 mg/dL   Triglycerides 42 0 - 149 mg/dL   HDL 71 >39 mg/dL   VLDL Cholesterol Cal 8 5 - 40 mg/dL   LDL Calculated 134 (H) 0 - 99 mg/dL   Chol/HDL Ratio 3.0 0.0 - 4.4 ratio  TSH  Result Value Ref Range   TSH 1.370 0.450 - 4.500 uIU/mL      Assessment & Plan:   1. Well adult exam - CBC with Differential/Platelet - CMP14+EGFR - Lipid panel - TSH   Continue all other maintenance medications as listed above.  Follow up plan: No follow-ups on file.  Educational handout given for health maintenance  Terald Sleeper PA-C [facility] [address]  [place], [place] [address] [phone]   04/15/2018, 2:34 PM

## 2018-05-19 ENCOUNTER — Other Ambulatory Visit: Payer: Self-pay | Admitting: Physician Assistant

## 2018-05-19 ENCOUNTER — Telehealth: Payer: Self-pay | Admitting: Physician Assistant

## 2018-05-19 MED ORDER — OSELTAMIVIR PHOSPHATE 75 MG PO CAPS: 75.0000 mg | ORAL_CAPSULE | Freq: Every day | ORAL | 0 refills | Status: DC

## 2018-07-05 ENCOUNTER — Other Ambulatory Visit: Payer: Self-pay | Admitting: Physician Assistant

## 2018-07-05 MED ORDER — CEPHALEXIN 500 MG PO CAPS: 500.0000 mg | ORAL_CAPSULE | Freq: Four times a day (QID) | ORAL | 0 refills | Status: DC

## 2019-09-02 ENCOUNTER — Telehealth: Payer: 59 | Admitting: Family Medicine

## 2019-11-02 ENCOUNTER — Ambulatory Visit: Payer: 59 | Admitting: Neurology

## 2019-12-21 ENCOUNTER — Ambulatory Visit (INDEPENDENT_AMBULATORY_CARE_PROVIDER_SITE_OTHER): Payer: 59 | Admitting: Family Medicine

## 2019-12-21 ENCOUNTER — Ambulatory Visit (INDEPENDENT_AMBULATORY_CARE_PROVIDER_SITE_OTHER): Payer: 59

## 2019-12-21 ENCOUNTER — Encounter: Payer: Self-pay | Admitting: Family Medicine

## 2019-12-21 DIAGNOSIS — U071 COVID-19: Secondary | ICD-10-CM

## 2019-12-21 MED ORDER — AZITHROMYCIN 250 MG PO TABS: ORAL_TABLET | ORAL | 0 refills | Status: DC

## 2019-12-21 MED ORDER — DEXAMETHASONE 6 MG PO TABS: 6.0000 mg | ORAL_TABLET | Freq: Every day | ORAL | 0 refills | Status: AC

## 2019-12-21 MED ORDER — ALBUTEROL SULFATE HFA 108 (90 BASE) MCG/ACT IN AERS: 2.0000 | INHALATION_SPRAY | RESPIRATORY_TRACT | 2 refills | Status: AC | PRN

## 2019-12-21 NOTE — Progress Notes (Signed)
Virtual Visit via Telephone Note  I connected with Nancy Arnold on 12/21/19 at 8:39 AM by telephone and verified that I am speaking with the correct person using two identifiers. Nancy Arnold is currently located at home and her husband is currently with her during this visit. The provider, Gwenlyn Fudge, FNP is located in their office at time of visit.  I discussed the limitations, risks, security and privacy concerns of performing an evaluation and management service by telephone and the availability of in person appointments. I also discussed with the patient that there may be a patient responsible charge related to this service. The patient expressed understanding and agreed to proceed.  Subjective: PCP: Gwenlyn Fudge, FNP  Chief Complaint  Patient presents with  . Covid Positive   Patient complains of cough, fever, sore throat, shortness of breath, left sided chest pain under her breast, chills, sweating, weakness, and dizziness.  Cough is dry and hacking. Fever has resolved. Chest pain worsens when she bends over or sits a certain way. She is unable to lay on her back and has been on her stomach or sides. Shortness of breath occurs with dizziness, which occurs with exertion. She has no way to check her oxygen saturation at home. Onset of symptoms was 6 days ago, gradually worsening since that time. Evaluation to date: at home COVID-19 test that resulted as positive. Treatment to date: cough suppressants, vitamins A,C, and D, and alternating Tylenol and Aleve. She does work in the school system.    ROS: Per HPI No current outpatient medications on file.  No Known Allergies History reviewed. No pertinent past medical history.  Observations/Objective: A&O  No respiratory distress or wheezing audible over the phone Mood, judgement, and thought processes all WNL  Assessment and Plan: 1. COVID-19 - Patient in quarantine. Started the below medications. Encouraged her to  add zinc to the vitamins she is taking. Explained how to use the Albuterol inhaler. She is going to come for a CXR this afternoon at 4 PM at which time we will check her oxygen saturation.  - azithromycin (ZITHROMAX Z-PAK) 250 MG tablet; Take 2 tablets (500 mg) PO today, then 1 tablet (250 mg) PO daily x4 days.  Dispense: 6 tablet; Refill: 0 - dexamethasone (DECADRON) 6 MG tablet; Take 1 tablet (6 mg total) by mouth daily for 7 days.  Dispense: 7 tablet; Refill: 0 - albuterol (VENTOLIN HFA) 108 (90 Base) MCG/ACT inhaler; Inhale 2 puffs into the lungs every 4 (four) hours as needed for wheezing or shortness of breath.  Dispense: 18 g; Refill: 2 - DG Chest 2 View; Future   Follow Up Instructions:  I discussed the assessment and treatment plan with the patient. The patient was provided an opportunity to ask questions and all were answered. The patient agreed with the plan and demonstrated an understanding of the instructions.   The patient was advised to call back or seek an in-person evaluation if the symptoms worsen or if the condition fails to improve as anticipated.  The above assessment and management plan was discussed with the patient. The patient verbalized understanding of and has agreed to the management plan. Patient is aware to call the clinic if symptoms persist or worsen. Patient is aware when to return to the clinic for a follow-up visit. Patient educated on when it is appropriate to go to the emergency department.   Time call ended: 9:00 AM  I provided 23 minutes of non-face-to-face time during  this encounter.  Deliah Boston, MSN, APRN, FNP-C [facility] 12/21/19

## 2020-01-10 ENCOUNTER — Ambulatory Visit: Payer: 59 | Admitting: Neurology

## 2020-01-18 ENCOUNTER — Telehealth: Payer: Self-pay | Admitting: *Deleted

## 2020-01-18 NOTE — Telephone Encounter (Signed)
Pt was previously rescheduled to 03/26/20 from 10/19 when provider was out. I called the pt and LVM offering to move her up her appt to an opening Dr Lucia Gaskins has on 02/15/20 @ 2:00 pm. I left the office number for pt to call back if she would like to be scheduled sooner than January.   When she calls back, please check to see if November 24th 2 pm and schedule if she accepts. Otherwise please check for sooner openings than 03/26/20 thanks.

## 2020-03-24 NOTE — L&D Delivery Note (Signed)
Delivery Note  SVD viable female Apgars 9,9 over intact perineum.  Placenta delivered spontaneously intact with 3VC. Good support and hemostasis noted.  R/V exam confirms.  PH art was not done.   Mother and baby to couplet care and are doing well.  EBL 100cc  Candice Camp, MD

## 2020-03-26 ENCOUNTER — Ambulatory Visit: Payer: 59 | Admitting: Neurology

## 2020-05-29 ENCOUNTER — Telehealth: Payer: Self-pay | Admitting: Neurology

## 2020-05-29 NOTE — Telephone Encounter (Signed)
Has canceled 4 new patient appointments. Please review.

## 2020-05-30 ENCOUNTER — Ambulatory Visit: Payer: 59 | Admitting: Neurology

## 2020-08-01 LAB — OB RESULTS CONSOLE RUBELLA ANTIBODY, IGM: Rubella: IMMUNE

## 2020-08-01 LAB — HEPATITIS C ANTIBODY: HCV Ab: NEGATIVE

## 2020-08-01 LAB — OB RESULTS CONSOLE HIV ANTIBODY (ROUTINE TESTING): HIV: NONREACTIVE

## 2020-08-01 LAB — OB RESULTS CONSOLE HEPATITIS B SURFACE ANTIGEN: Hepatitis B Surface Ag: NEGATIVE

## 2020-08-01 LAB — OB RESULTS CONSOLE RPR: RPR: NONREACTIVE

## 2020-08-10 LAB — OB RESULTS CONSOLE GC/CHLAMYDIA
Chlamydia: NEGATIVE
Gonorrhea: NEGATIVE

## 2020-09-15 ENCOUNTER — Encounter: Payer: Self-pay | Admitting: Family Medicine

## 2020-09-18 ENCOUNTER — Other Ambulatory Visit: Payer: Self-pay

## 2020-09-18 ENCOUNTER — Inpatient Hospital Stay (HOSPITAL_COMMUNITY)
Admission: AD | Admit: 2020-09-18 | Discharge: 2020-09-18 | Disposition: A | Discharge status: Home / Self Care | Payer: 59 | Attending: Obstetrics and Gynecology | Admitting: Obstetrics and Gynecology

## 2020-09-18 ENCOUNTER — Encounter (HOSPITAL_COMMUNITY): Payer: Self-pay | Admitting: Obstetrics and Gynecology

## 2020-09-18 DIAGNOSIS — O219 Vomiting of pregnancy, unspecified: Secondary | ICD-10-CM

## 2020-09-18 DIAGNOSIS — J329 Chronic sinusitis, unspecified: Secondary | ICD-10-CM | POA: Insufficient documentation

## 2020-09-18 DIAGNOSIS — O99512 Diseases of the respiratory system complicating pregnancy, second trimester: Secondary | ICD-10-CM

## 2020-09-18 DIAGNOSIS — O218 Other vomiting complicating pregnancy: Secondary | ICD-10-CM | POA: Diagnosis not present

## 2020-09-18 DIAGNOSIS — J0100 Acute maxillary sinusitis, unspecified: Secondary | ICD-10-CM

## 2020-09-18 DIAGNOSIS — J018 Other acute sinusitis: Secondary | ICD-10-CM

## 2020-09-18 DIAGNOSIS — R112 Nausea with vomiting, unspecified: Secondary | ICD-10-CM | POA: Diagnosis not present

## 2020-09-18 DIAGNOSIS — Z20822 Contact with and (suspected) exposure to covid-19: Secondary | ICD-10-CM | POA: Diagnosis not present

## 2020-09-18 DIAGNOSIS — Z3A16 16 weeks gestation of pregnancy: Secondary | ICD-10-CM | POA: Diagnosis not present

## 2020-09-18 DIAGNOSIS — Z79899 Other long term (current) drug therapy: Secondary | ICD-10-CM | POA: Insufficient documentation

## 2020-09-18 HISTORY — DX: Migraine, unspecified, not intractable, without status migrainosus: G43.909

## 2020-09-18 LAB — URINALYSIS, ROUTINE W REFLEX MICROSCOPIC
Bilirubin Urine: NEGATIVE
Glucose, UA: NEGATIVE mg/dL
Hgb urine dipstick: NEGATIVE
Ketones, ur: 80 mg/dL — AB
Leukocytes,Ua: NEGATIVE
Nitrite: NEGATIVE
Protein, ur: NEGATIVE mg/dL
Specific Gravity, Urine: 1.028 (ref 1.005–1.030)
pH: 6 (ref 5.0–8.0)

## 2020-09-18 MED ORDER — DIPHENHYDRAMINE HCL 50 MG/ML IJ SOLN
12.5000 mg | Freq: Once | INTRAMUSCULAR | Status: AC
  Administered 2020-09-18: 19:00:00 12.5 mg via INTRAVENOUS
  Filled 2020-09-18: qty 1

## 2020-09-18 MED ORDER — LACTATED RINGERS IV BOLUS
1000.0000 mL | Freq: Once | INTRAVENOUS | Status: AC
  Administered 2020-09-18: 19:00:00 1000 mL via INTRAVENOUS

## 2020-09-18 MED ORDER — METOCLOPRAMIDE HCL 10 MG PO TABS: 10.0000 mg | ORAL_TABLET | Freq: Three times a day (TID) | ORAL | 1 refills | Status: DC

## 2020-09-18 MED ORDER — METOCLOPRAMIDE HCL 5 MG/ML IJ SOLN
10.0000 mg | Freq: Once | INTRAMUSCULAR | Status: AC
  Administered 2020-09-18: 19:00:00 10 mg via INTRAVENOUS
  Filled 2020-09-18: qty 2

## 2020-09-18 NOTE — Discharge Instructions (Addendum)

## 2020-09-18 NOTE — MAU Note (Signed)
Nancy Arnold is a 30 y.o. at [redacted]w[redacted]d here in MAU reporting: has had a sinus infection . And this AM started vomiting. States she has not been able to keep anything down. Started having some cramping and a headache.   Onset of complaint: ongoing  Pain score: abdominal cramping 3/10, headache 5/10  Vitals:   09/18/20 1750  BP: 107/66  Pulse: 86  Resp: 16  Temp: 98.2 F (36.8 C)  SpO2: 99%     FHT:150  Lab orders placed from triage: UA

## 2020-09-18 NOTE — MAU Provider Note (Signed)
History     CSN: [id number]  Arrival date and time: 09/18/20 1732   Event Date/Time   First Provider Initiated Contact with Patient 09/18/20 1836      Chief Complaint  Patient presents with   Nausea   Emesis   Abdominal Pain   Headache   HPI  Patient is a [id number] at [redacted]w[redacted]d who presents for nausea, vomiting, and congestion. Reports developing symptoms of sinus infection on Friday including bilateral sinus pain, frontal headache, congestion. Denies fevers with this. Has been taking tylenol prn. She has had a bad headache the past few days that has resolved with tylenol. This morning as soon as she woke up she was nauseous and had to run to the bathroom to vomit. Since this morning, she has been unable to tolerate PO. Multiple episodes of vomiting. She reports her congestion is better today than yesterday. She has had three episodes of diarrhea. No abdominal pain. Main issue is nausea.   OB History     Gravida  3   Para  2   Term  1   Preterm  1   AB  0   Living  2      SAB  0   IAB  0   Ectopic  0   Multiple  0   Live Births  2           Past Medical History:  Diagnosis Date   Migraines     Past Surgical History:  Procedure Laterality Date   ADENOIDECTOMY     EYE SURGERY     EYE SURGERY     age 30  to correct an eye that turned in   TONSILLECTOMY      Family History  Problem Relation Age of Onset   Anesthesia problems Other    Hypertension Father    Early death Paternal Uncle    Cancer Paternal Uncle    Arthritis Maternal Grandmother    Hearing loss Maternal Grandmother    Hypertension Maternal Grandmother    Alcohol abuse Maternal Grandfather    Arthritis Paternal Grandmother    Asthma Paternal Grandmother    COPD Paternal Grandmother    Hypertension Paternal Grandmother    Stroke Paternal Grandfather     Social History   Tobacco Use   Smoking status: Never   Smokeless tobacco: Never  Vaping Use   Vaping Use: Never used   Substance Use Topics   Alcohol use: No   Drug use: No    Allergies: No Known Allergies  Medications Prior to Admission  Medication Sig Dispense Refill Last Dose   Prenatal Vit-Fe Fumarate-FA (MULTIVITAMIN-PRENATAL) 27-0.8 MG TABS tablet Take 1 tablet by mouth daily at 12 noon.   09/17/2020   albuterol (VENTOLIN HFA) 108 (90 Base) MCG/ACT inhaler Inhale 2 puffs into the lungs every 4 (four) hours as needed for wheezing or shortness of breath. 18 g 2    azithromycin (ZITHROMAX Z-PAK) 250 MG tablet Take 2 tablets (500 mg) PO today, then 1 tablet (250 mg) PO daily x4 days. 6 tablet 0     Review of Systems  Constitutional:  Negative for chills, fatigue and fever.  HENT:  Positive for congestion, postnasal drip, rhinorrhea, sinus pressure, sinus pain and sneezing.   Respiratory:  Negative for cough and shortness of breath.   Cardiovascular:  Negative for chest pain.  Gastrointestinal:  Positive for diarrhea, nausea and vomiting. Negative for abdominal pain.  Musculoskeletal:  Negative for back pain.  Skin:  Negative for rash.  Neurological:  Positive for headaches. Negative for dizziness.  Physical Exam   Blood pressure 100/62, pulse 78, temperature 98.2 F (36.8 C), temperature source Oral, resp. rate 16, height 5\' 3"  (1.6 m), weight 63.7 kg, SpO2 100 %, unknown if currently breastfeeding.  Physical Exam Vitals and nursing note reviewed. Exam conducted with a chaperone present.  Constitutional:      Appearance: She is well-developed.  Cardiovascular:     Rate and Rhythm: Normal rate.  Pulmonary:     Effort: Pulmonary effort is normal.  Abdominal:     Palpations: Abdomen is soft.     Tenderness: There is no abdominal tenderness.  Skin:    General: Skin is warm and dry.  Neurological:     Mental Status: She is alert.  Psychiatric:        Mood and Affect: Mood normal.        Behavior: Behavior normal.    MAU Course  Procedures  MDM Pt evaluated at bedside IV LR, Reglan,  Benadryl, Covid Swab  UA demonstrates 80 ketones, neg leukocytes, neg nitrites Pt reevaluated and feeling greatly improved after fluids and Reglan  Assessment and Plan  Nausea and Vomiting, Acute rhinosinusitis -discharge home with script for reglan -counseled on safe OTC meds -f/u Covid test, precautions given   09/18/2020, 8:39 PM

## 2020-09-19 LAB — SARS CORONAVIRUS 2 (TAT 6-24 HRS): SARS Coronavirus 2: NEGATIVE

## 2021-02-06 LAB — OB RESULTS CONSOLE GBS: GBS: NEGATIVE

## 2021-02-20 ENCOUNTER — Inpatient Hospital Stay (HOSPITAL_COMMUNITY)
Admission: AD | Admit: 2021-02-20 | Discharge: 2021-02-20 | Disposition: A | Discharge status: Home / Self Care | Payer: Commercial Managed Care - PPO | Attending: Obstetrics and Gynecology | Admitting: Obstetrics and Gynecology

## 2021-02-20 ENCOUNTER — Encounter (HOSPITAL_COMMUNITY): Payer: Self-pay | Admitting: Obstetrics and Gynecology

## 2021-02-20 DIAGNOSIS — G43909 Migraine, unspecified, not intractable, without status migrainosus: Secondary | ICD-10-CM | POA: Insufficient documentation

## 2021-02-20 DIAGNOSIS — G43109 Migraine with aura, not intractable, without status migrainosus: Secondary | ICD-10-CM | POA: Diagnosis not present

## 2021-02-20 DIAGNOSIS — Z3A38 38 weeks gestation of pregnancy: Secondary | ICD-10-CM | POA: Insufficient documentation

## 2021-02-20 DIAGNOSIS — G43809 Other migraine, not intractable, without status migrainosus: Secondary | ICD-10-CM | POA: Diagnosis present

## 2021-02-20 DIAGNOSIS — O99891 Other specified diseases and conditions complicating pregnancy: Secondary | ICD-10-CM | POA: Diagnosis not present

## 2021-02-20 DIAGNOSIS — Z0371 Encounter for suspected problem with amniotic cavity and membrane ruled out: Secondary | ICD-10-CM

## 2021-02-20 DIAGNOSIS — O26893 Other specified pregnancy related conditions, third trimester: Secondary | ICD-10-CM | POA: Diagnosis not present

## 2021-02-20 LAB — URINALYSIS, ROUTINE W REFLEX MICROSCOPIC
Bilirubin Urine: NEGATIVE
Glucose, UA: NEGATIVE mg/dL
Hgb urine dipstick: NEGATIVE
Ketones, ur: >80 mg/dL — AB
Leukocytes,Ua: NEGATIVE
Nitrite: NEGATIVE
Protein, ur: NEGATIVE mg/dL
Specific Gravity, Urine: 1.02 (ref 1.005–1.030)
pH: 6.5 (ref 5.0–8.0)

## 2021-02-20 LAB — CBC
HCT: 37.3 % (ref 36.0–46.0)
Hemoglobin: 12 g/dL (ref 12.0–15.0)
MCH: 26.2 pg (ref 26.0–34.0)
MCHC: 32.2 g/dL (ref 30.0–36.0)
MCV: 81.4 fL (ref 80.0–100.0)
Platelets: 226 10*3/uL (ref 150–400)
RBC: 4.58 MIL/uL (ref 3.87–5.11)
RDW: 13.2 % (ref 11.5–15.5)
WBC: 13.7 10*3/uL — ABNORMAL HIGH (ref 4.0–10.5)
nRBC: 0.1 % (ref 0.0–0.2)

## 2021-02-20 LAB — COMPREHENSIVE METABOLIC PANEL
ALT: 23 U/L (ref 0–44)
AST: 24 U/L (ref 15–41)
Albumin: 2.9 g/dL — ABNORMAL LOW (ref 3.5–5.0)
Alkaline Phosphatase: 153 U/L — ABNORMAL HIGH (ref 38–126)
Anion gap: 9 (ref 5–15)
BUN: <5 mg/dL — ABNORMAL LOW (ref 6–20)
CO2: 23 mmol/L (ref 22–32)
Calcium: 9 mg/dL (ref 8.9–10.3)
Chloride: 104 mmol/L (ref 98–111)
Creatinine, Ser: 0.63 mg/dL (ref 0.44–1.00)
GFR, Estimated: >60 mL/min (ref >60)
Glucose, Bld: 107 mg/dL — ABNORMAL HIGH (ref 70–99)
Potassium: 3.5 mmol/L (ref 3.5–5.1)
Sodium: 136 mmol/L (ref 135–145)
Total Bilirubin: 0.5 mg/dL (ref 0.3–1.2)
Total Protein: 6.1 g/dL — ABNORMAL LOW (ref 6.5–8.1)

## 2021-02-20 LAB — POCT FERN TEST: POCT Fern Test: NEGATIVE

## 2021-02-20 MED ORDER — PROMETHAZINE HCL 25 MG PO TABS: 25.0000 mg | ORAL_TABLET | Freq: Four times a day (QID) | ORAL | 0 refills | Status: DC | PRN

## 2021-02-20 MED ORDER — DEXAMETHASONE SODIUM PHOSPHATE 10 MG/ML IJ SOLN
10.0000 mg | Freq: Once | INTRAMUSCULAR | Status: AC
  Administered 2021-02-20: 10 mg via INTRAVENOUS
  Filled 2021-02-20: qty 1

## 2021-02-20 MED ORDER — LACTATED RINGERS IV BOLUS: 1000.0000 mL | Freq: Once | INTRAVENOUS | Status: DC

## 2021-02-20 MED ORDER — LACTATED RINGERS IV BOLUS
1000.0000 mL | Freq: Once | INTRAVENOUS | Status: AC
  Administered 2021-02-20: 1000 mL via INTRAVENOUS

## 2021-02-20 MED ORDER — DIPHENHYDRAMINE HCL 50 MG/ML IJ SOLN
25.0000 mg | Freq: Once | INTRAMUSCULAR | Status: AC
  Administered 2021-02-20: 25 mg via INTRAVENOUS
  Filled 2021-02-20: qty 1

## 2021-02-20 MED ORDER — METOCLOPRAMIDE HCL 5 MG/ML IJ SOLN
10.0000 mg | Freq: Once | INTRAMUSCULAR | Status: AC
  Administered 2021-02-20: 10 mg via INTRAVENOUS
  Filled 2021-02-20: qty 2

## 2021-02-20 MED ORDER — SUMATRIPTAN SUCCINATE 6 MG/0.5ML ~~LOC~~ SOLN
6.0000 mg | Freq: Once | SUBCUTANEOUS | Status: AC
  Administered 2021-02-20: 6 mg via SUBCUTANEOUS
  Filled 2021-02-20: qty 0.5

## 2021-02-20 MED ORDER — M.V.I. ADULT IV INJ
Freq: Once | INTRAVENOUS | Status: AC
  Filled 2021-02-20: qty 10

## 2021-02-20 MED ORDER — SODIUM CHLORIDE 0.9 % IV SOLN
25.0000 mg | Freq: Once | INTRAVENOUS | Status: AC
  Administered 2021-02-20: 25 mg via INTRAVENOUS
  Filled 2021-02-20: qty 1

## 2021-02-20 MED ORDER — MAGNESIUM SULFATE 2 GM/50ML IV SOLN
2.0000 g | Freq: Once | INTRAVENOUS | Status: AC
  Administered 2021-02-20: 2 g via INTRAVENOUS
  Filled 2021-02-20: qty 50

## 2021-02-20 NOTE — MAU Note (Signed)
Started with a HA 2 days ago.  Couldn't sleep last night.  Feels like a band around her head, left eye is seeing black spots. Hx of migraines. Has taken Tylenol and Benadryl for HA, no relief. No bleeding, ? Leaking - thinks it is urine "underwear just kind of stays wet". +FM

## 2021-02-20 NOTE — MAU Provider Note (Signed)
History     CSN: [id number]  Arrival date and time: 02/20/21 [id number]   Event Date/Time   First Provider Initiated Contact with Patient 02/20/21 [phone]      Chief Complaint  Patient presents with   Headache   Emesis   HPI ALAYLAH TOMASKO is a 30 y.o. [id number] at [redacted]w[redacted]d who presents to MAU with chief complaint of migraine in the setting of history of migraines. Current migraine began two days ago. Patient states her migraines are typically accompanied by spots in her right field of vision but her current migraine features spots in her left field of vision. Pain score is 8/10 "like a band around my head". She has attempted treatment with Tylenol and Benadryl but has not experienced relief.   Patient endorses recurrent vomiting, new onset within the past two days. She denies fever, no one else in her family is sick. She states on the way to MAU she felt very weak but denies syncope, activity intolerance.  Patient also c/o "leaking". This is a new problem, onset today. She believes she is dribbling urine but is not sure. She denies gush of fluid.  She denies vaginal bleeding, decreased fetal movement, fever, falls, or recent illness.    OB History     Gravida  3   Para  2   Term  1   Preterm  1   AB  0   Living  2      SAB  0   IAB  0   Ectopic  0   Multiple  0   Live Births  2           Past Medical History:  Diagnosis Date   Migraines     Past Surgical History:  Procedure Laterality Date   ADENOIDECTOMY     EYE SURGERY     EYE SURGERY     age 42  to correct an eye that turned in   TONSILLECTOMY      Family History  Problem Relation Age of Onset   Anesthesia problems Other    Hypertension Father    Early death Paternal Uncle    Cancer Paternal Uncle    Arthritis Maternal Grandmother    Hearing loss Maternal Grandmother    Hypertension Maternal Grandmother    Alcohol abuse Maternal Grandfather    Arthritis Paternal Grandmother    Asthma Paternal  Grandmother    COPD Paternal Grandmother    Hypertension Paternal Grandmother    Stroke Paternal Grandfather     Social History   Tobacco Use   Smoking status: Never   Smokeless tobacco: Never  Vaping Use   Vaping Use: Never used  Substance Use Topics   Alcohol use: No   Drug use: No    Allergies: No Known Allergies  Medications Prior to Admission  Medication Sig Dispense Refill Last Dose   acetaminophen (TYLENOL) 500 MG tablet Take 1,000 mg by mouth every 6 (six) hours as needed.   02/20/2021 at 0430   diphenhydrAMINE (BENADRYL) 25 mg capsule Take 25 mg by mouth every 6 (six) hours as needed.   02/19/2021 at 2030   ondansetron (ZOFRAN) 4 MG tablet Take 4 mg by mouth every 8 (eight) hours as needed for nausea or vomiting.   02/20/2021 at 0730   Prenatal Vit-Fe Fumarate-FA (MULTIVITAMIN-PRENATAL) 27-0.8 MG TABS tablet Take 1 tablet by mouth daily at 12 noon.   Past Week   albuterol (VENTOLIN HFA) 108 (90 Base) MCG/ACT inhaler Inhale  2 puffs into the lungs every 4 (four) hours as needed for wheezing or shortness of breath. 18 g 2    azithromycin (ZITHROMAX Z-PAK) 250 MG tablet Take 2 tablets (500 mg) PO today, then 1 tablet (250 mg) PO daily x4 days. 6 tablet 0    metoCLOPramide (REGLAN) 10 MG tablet Take 1 tablet (10 mg total) by mouth 3 (three) times daily with meals. 90 tablet 1     Review of Systems  Eyes:  Positive for visual disturbance.  Gastrointestinal:  Positive for abdominal pain.  Genitourinary:  Positive for vaginal discharge.  Neurological:  Positive for headaches.  All other systems reviewed and are negative. Physical Exam   Blood pressure (!) 97/53, pulse 80, temperature 97.9 F (36.6 C), temperature source Oral, resp. rate 17, height 5\' 3"  (1.6 m), weight 79.3 kg, SpO2 100 %, unknown if currently breastfeeding.  Physical Exam Vitals and nursing note reviewed.  Constitutional:      Appearance: She is well-developed.  Cardiovascular:     Rate and Rhythm:  Normal rate and regular rhythm.     Heart sounds: Normal heart sounds.  Pulmonary:     Effort: Pulmonary effort is normal.     Breath sounds: Normal breath sounds.  Abdominal:     Comments: Gravid  Genitourinary:    Comments: Pelvic exam: External genitalia normal, vaginal walls pink and well rugated, cervix visually closed, no lesions noted. Negative pooling.   Skin:    General: Skin is warm and dry.     Capillary Refill: Capillary refill takes less than 2 seconds.  Neurological:     Mental Status: She is alert.  Psychiatric:        Mood and Affect: Mood normal.        Speech: Speech normal.        Behavior: Behavior normal.    MAU Course  Procedures  --Pain score decreased from 8 to 7/10 one hour after HA cocktail. Will order Imitrex SQ and Magnesium per ACOG HA algorithm  --[id number]: notified by [facility], RN that patient reports "back of head exploding". Will consult Neuro  --1209: Neuro on call Dr. Cheral Marker returned page, provided additional interventions. Orders placed  --Patient endorses pain score 3/10, resolution of headache pressure halfway through Phenergan bag.  --Reactive tracing: baseline 140, mod var, + accels, no decels  --Toco: irregular contractions  --Intact amniotic sac: negative pooling, negative fern  Patient Vitals for the past 24 hrs:  BP Temp Temp src Pulse Resp SpO2 Height Weight  02/20/21 1434 (!) 103/59 -- -- 93 15 100 % -- --  02/20/21 1201 (!) 97/53 -- -- 80 -- -- -- --  02/20/21 1146 (!) 104/53 -- -- 78 17 100 % -- --  02/20/21 0947 107/63 -- -- 86 -- -- -- --  02/20/21 0926 121/77 97.9 F (36.6 C) Oral 98 16 100 % 5\' 3"  (1.6 m) 79.3 kg   Results for orders placed or performed during the hospital encounter of 02/20/21 (from the past 24 hour(s))  Urinalysis, Routine w reflex microscopic Urine, Clean Catch     Status: Abnormal   Collection Time: 02/20/21  9:21 AM  Result Value Ref Range   Color, Urine YELLOW YELLOW   APPearance CLEAR CLEAR    Specific Gravity, Urine 1.020 1.005 - 1.030   pH 6.5 5.0 - 8.0   Glucose, UA NEGATIVE NEGATIVE mg/dL   Hgb urine dipstick NEGATIVE NEGATIVE   Bilirubin Urine NEGATIVE NEGATIVE   Ketones, ur >80 (  A) NEGATIVE mg/dL   Protein, ur NEGATIVE NEGATIVE mg/dL   Nitrite NEGATIVE NEGATIVE   Leukocytes,Ua NEGATIVE NEGATIVE  CBC     Status: Abnormal   Collection Time: 02/20/21 10:06 AM  Result Value Ref Range   WBC 13.7 (H) 4.0 - 10.5 K/uL   RBC 4.58 3.87 - 5.11 MIL/uL   Hemoglobin 12.0 12.0 - 15.0 g/dL   HCT 22.2 97.9 - 89.2 %   MCV 81.4 80.0 - 100.0 fL   MCH 26.2 26.0 - 34.0 pg   MCHC 32.2 30.0 - 36.0 g/dL   RDW 11.9 41.7 - 40.8 %   Platelets 226 150 - 400 K/uL   nRBC 0.1 0.0 - 0.2 %  Comprehensive metabolic panel     Status: Abnormal   Collection Time: 02/20/21 10:06 AM  Result Value Ref Range   Sodium 136 135 - 145 mmol/L   Potassium 3.5 3.5 - 5.1 mmol/L   Chloride 104 98 - 111 mmol/L   CO2 23 22 - 32 mmol/L   Glucose, Bld 107 (H) 70 - 99 mg/dL   BUN <5 (L) 6 - 20 mg/dL   Creatinine, Ser 1.44 0.44 - 1.00 mg/dL   Calcium 9.0 8.9 - 81.8 mg/dL   Total Protein 6.1 (L) 6.5 - 8.1 g/dL   Albumin 2.9 (L) 3.5 - 5.0 g/dL   AST 24 15 - 41 U/L   ALT 23 0 - 44 U/L   Alkaline Phosphatase 153 (H) 38 - 126 U/L   Total Bilirubin 0.5 0.3 - 1.2 mg/dL   GFR, Estimated >56 >31 mL/min   Anion gap 9 5 - 15  Fern Test     Status: None   Collection Time: 02/20/21  1:24 PM  Result Value Ref Range   POCT Fern Test Negative = intact amniotic membranes    Meds ordered this encounter  Medications   metoCLOPramide (REGLAN) injection 10 mg   diphenhydrAMINE (BENADRYL) injection 25 mg   dexamethasone (DECADRON) injection 10 mg   lactated ringers bolus 1,000 mL   M.V.I. Adult (INFUVITE ADULT) 10 mL in lactated ringers 1,000 mL infusion   SUMAtriptan (IMITREX) injection 6 mg    Minimal improvement in pain score s/p headache cocktail   magnesium sulfate IVPB 2 g 50 mL   promethazine (PHENERGAN) 25 mg in  sodium chloride 0.9 % 1,000 mL infusion   promethazine (PHENERGAN) 25 MG tablet    Sig: Take 1 tablet (25 mg total) by mouth every 6 (six) hours as needed for nausea or vomiting.    Dispense:  30 tablet    Refill:  0    Order Specific Question:   Supervising Provider    Answer:   Warden Fillers [[id number]]   Assessment and Plan  --30 y.o. [id number] at [redacted]w[redacted]d  --Reactive tracing --Intact amniotic sac --Migraine resolved with treatments s/p consult with Neuro --Discharge home in stable condition  F/U: Patient is scheduled for IOL this weekend  Clayton Bibles, MSA, MSN, CNM Certified Nurse Midwife, [employer] for [employer], [facility]   Calvert Cantor, [place] 02/20/2021, 6:42 PM

## 2021-02-22 ENCOUNTER — Other Ambulatory Visit: Payer: Self-pay | Admitting: Obstetrics and Gynecology

## 2021-02-22 LAB — SARS CORONAVIRUS 2 (TAT 6-24 HRS): SARS Coronavirus 2: NEGATIVE

## 2021-02-25 ENCOUNTER — Inpatient Hospital Stay (HOSPITAL_COMMUNITY): Payer: Commercial Managed Care - PPO

## 2021-02-25 NOTE — H&P (Signed)
Nancy Arnold is a 30 y.o. female presenting for elective IOL. She has a history of two prior SVD - first was IOL for PIH at 42 wga and second was at 20 wga. She has two boys from those pregnancies Alan [place] and [place]). This is an IUI pregnancy with donor sperm. She is a expecting a RR girl - "Ronney Asters"!!  She is GBS NEGATIVE this pregnancy.   OB History     Gravida  3   Para  2   Term  1   Preterm  1   AB  0   Living  2      SAB  0   IAB  0   Ectopic  0   Multiple  0   Live Births  2          Past Medical History:  Diagnosis Date   Migraines    Past Surgical History:  Procedure Laterality Date   ADENOIDECTOMY     EYE SURGERY     EYE SURGERY     age 30  to correct an eye that turned in   TONSILLECTOMY     Family History: family history includes Alcohol abuse in her maternal grandfather; Anesthesia problems in an other family member; Arthritis in her maternal grandmother and paternal grandmother; Asthma in her paternal grandmother; COPD in her paternal grandmother; Cancer in her paternal uncle; Early death in her paternal uncle; Hearing loss in her maternal grandmother; Hypertension in her father, maternal grandmother, and paternal grandmother; Stroke in her paternal grandfather. Social History:  reports that she has never smoked. She has never used smokeless tobacco. She reports that she does not drink alcohol and does not use drugs.     Maternal Diabetes: No Genetic Screening: Normal Maternal Ultrasounds/Referrals: Normal Fetal Ultrasounds or other Referrals:  None Maternal Substance Abuse:  No Significant Maternal Medications:  None Significant Maternal Lab Results:  Group B Strep negative Other Comments:  None  Review of Systems History   unknown if currently breastfeeding. Exam Physical Exam  (from office) NAD, A&O NWOB Abd soft, nondistended, gravid  Prenatal labs: ABO, Rh:   Antibody:   Rubella:   RPR:    HBsAg:    HIV:    GBS:  Negative/-- (11/16 0000)   Assessment/Plan: 30 yo [id number] @ 39.0wga presenting for IOL s/s elective reasons. Cervix favorable. Plan for Pitocin/AROM. GBS negative.     Ranae Pila 02/25/2021, 9:38 AM

## 2021-02-26 ENCOUNTER — Inpatient Hospital Stay (HOSPITAL_COMMUNITY): Payer: Commercial Managed Care - PPO | Admitting: Anesthesiology

## 2021-02-26 ENCOUNTER — Encounter (HOSPITAL_COMMUNITY): Payer: Self-pay | Admitting: Obstetrics & Gynecology

## 2021-02-26 ENCOUNTER — Inpatient Hospital Stay (HOSPITAL_COMMUNITY)
Admission: AD | Admit: 2021-02-26 | Discharge: 2021-02-27 | DRG: 807 | Disposition: A | Discharge status: Home / Self Care | Payer: Commercial Managed Care - PPO | Attending: Obstetrics & Gynecology | Admitting: Obstetrics & Gynecology

## 2021-02-26 ENCOUNTER — Other Ambulatory Visit: Payer: Self-pay

## 2021-02-26 DIAGNOSIS — O26893 Other specified pregnancy related conditions, third trimester: Secondary | ICD-10-CM | POA: Diagnosis present

## 2021-02-26 DIAGNOSIS — Z3490 Encounter for supervision of normal pregnancy, unspecified, unspecified trimester: Secondary | ICD-10-CM

## 2021-02-26 DIAGNOSIS — Z3A39 39 weeks gestation of pregnancy: Secondary | ICD-10-CM | POA: Diagnosis not present

## 2021-02-26 LAB — CBC
HCT: 36.8 % (ref 36.0–46.0)
Hemoglobin: 11.7 g/dL — ABNORMAL LOW (ref 12.0–15.0)
MCH: 25.7 pg — ABNORMAL LOW (ref 26.0–34.0)
MCHC: 31.8 g/dL (ref 30.0–36.0)
MCV: 80.7 fL (ref 80.0–100.0)
Platelets: 223 10*3/uL (ref 150–400)
RBC: 4.56 MIL/uL (ref 3.87–5.11)
RDW: 13.6 % (ref 11.5–15.5)
WBC: 12.8 10*3/uL — ABNORMAL HIGH (ref 4.0–10.5)
nRBC: 0 % (ref 0.0–0.2)

## 2021-02-26 LAB — TYPE AND SCREEN
ABO/RH(D): O POS
Antibody Screen: NEGATIVE

## 2021-02-26 LAB — RPR: RPR Ser Ql: NONREACTIVE

## 2021-02-26 MED ORDER — PHENYLEPHRINE 40 MCG/ML (10ML) SYRINGE FOR IV PUSH (FOR BLOOD PRESSURE SUPPORT): 80.0000 ug | PREFILLED_SYRINGE | INTRAVENOUS | Status: DC | PRN

## 2021-02-26 MED ORDER — ACETAMINOPHEN 325 MG PO TABS: 650.0000 mg | ORAL_TABLET | ORAL | Status: DC | PRN

## 2021-02-26 MED ORDER — OXYCODONE-ACETAMINOPHEN 5-325 MG PO TABS: 2.0000 | ORAL_TABLET | ORAL | Status: DC | PRN

## 2021-02-26 MED ORDER — ONDANSETRON HCL 4 MG/2ML IJ SOLN: 4.0000 mg | Freq: Four times a day (QID) | INTRAMUSCULAR | Status: DC | PRN

## 2021-02-26 MED ORDER — OXYTOCIN BOLUS FROM INFUSION
333.0000 mL | Freq: Once | INTRAVENOUS | Status: AC
  Administered 2021-02-26: 333 mL via INTRAVENOUS

## 2021-02-26 MED ORDER — ONDANSETRON HCL 4 MG/2ML IJ SOLN: 4.0000 mg | INTRAMUSCULAR | Status: DC | PRN

## 2021-02-26 MED ORDER — IBUPROFEN 600 MG PO TABS
600.0000 mg | ORAL_TABLET | Freq: Four times a day (QID) | ORAL | Status: DC
  Administered 2021-02-26 – 2021-02-27 (×4): 600 mg via ORAL
  Filled 2021-02-26 (×4): qty 1

## 2021-02-26 MED ORDER — LIDOCAINE HCL (PF) 1 % IJ SOLN: 30.0000 mL | INTRAMUSCULAR | Status: DC | PRN

## 2021-02-26 MED ORDER — DIPHENHYDRAMINE HCL 25 MG PO CAPS: 25.0000 mg | ORAL_CAPSULE | Freq: Four times a day (QID) | ORAL | Status: DC | PRN

## 2021-02-26 MED ORDER — MEDROXYPROGESTERONE ACETATE 150 MG/ML IM SUSP: 150.0000 mg | INTRAMUSCULAR | Status: DC | PRN

## 2021-02-26 MED ORDER — SENNOSIDES-DOCUSATE SODIUM 8.6-50 MG PO TABS
2.0000 | ORAL_TABLET | Freq: Every day | ORAL | Status: DC
  Administered 2021-02-27: 2 via ORAL
  Filled 2021-02-26: qty 2

## 2021-02-26 MED ORDER — TETANUS-DIPHTH-ACELL PERTUSSIS 5-2.5-18.5 LF-MCG/0.5 IM SUSY: 0.5000 mL | PREFILLED_SYRINGE | Freq: Once | INTRAMUSCULAR | Status: DC

## 2021-02-26 MED ORDER — OXYTOCIN-SODIUM CHLORIDE 30-0.9 UT/500ML-% IV SOLN
1.0000 m[IU]/min | INTRAVENOUS | Status: DC
  Administered 2021-02-26: 2 m[IU]/min via INTRAVENOUS
  Filled 2021-02-26: qty 500

## 2021-02-26 MED ORDER — DIBUCAINE (PERIANAL) 1 % EX OINT: 1.0000 "application " | TOPICAL_OINTMENT | CUTANEOUS | Status: DC | PRN

## 2021-02-26 MED ORDER — TERBUTALINE SULFATE 1 MG/ML IJ SOLN: 0.2500 mg | Freq: Once | INTRAMUSCULAR | Status: DC | PRN

## 2021-02-26 MED ORDER — EPHEDRINE 5 MG/ML INJ: 10.0000 mg | INTRAVENOUS | Status: DC | PRN

## 2021-02-26 MED ORDER — LIDOCAINE HCL (PF) 1 % IJ SOLN
INTRAMUSCULAR | Status: DC | PRN
  Administered 2021-02-26 (×2): 4 mL via EPIDURAL

## 2021-02-26 MED ORDER — OXYCODONE-ACETAMINOPHEN 5-325 MG PO TABS: 1.0000 | ORAL_TABLET | ORAL | Status: DC | PRN

## 2021-02-26 MED ORDER — LACTATED RINGERS IV SOLN: INTRAVENOUS | Status: DC

## 2021-02-26 MED ORDER — PRENATAL MULTIVITAMIN CH
1.0000 | ORAL_TABLET | Freq: Every day | ORAL | Status: DC
  Administered 2021-02-27: 1 via ORAL
  Filled 2021-02-26: qty 1

## 2021-02-26 MED ORDER — ZOLPIDEM TARTRATE 5 MG PO TABS: 5.0000 mg | ORAL_TABLET | Freq: Every evening | ORAL | Status: DC | PRN

## 2021-02-26 MED ORDER — ONDANSETRON HCL 4 MG PO TABS: 4.0000 mg | ORAL_TABLET | ORAL | Status: DC | PRN

## 2021-02-26 MED ORDER — FENTANYL-BUPIVACAINE-NACL 0.5-0.125-0.9 MG/250ML-% EP SOLN
12.0000 mL/h | EPIDURAL | Status: DC | PRN
  Administered 2021-02-26: 12 mL/h via EPIDURAL
  Filled 2021-02-26: qty 250

## 2021-02-26 MED ORDER — LACTATED RINGERS IV SOLN
500.0000 mL | Freq: Once | INTRAVENOUS | Status: AC
  Administered 2021-02-26: 500 mL via INTRAVENOUS

## 2021-02-26 MED ORDER — LACTATED RINGERS IV SOLN: 500.0000 mL | INTRAVENOUS | Status: DC | PRN

## 2021-02-26 MED ORDER — SOD CITRATE-CITRIC ACID 500-334 MG/5ML PO SOLN
30.0000 mL | ORAL | Status: DC | PRN
  Administered 2021-02-26: 30 mL via ORAL

## 2021-02-26 MED ORDER — ALBUTEROL SULFATE (2.5 MG/3ML) 0.083% IN NEBU: 3.0000 mL | INHALATION_SOLUTION | RESPIRATORY_TRACT | Status: DC | PRN

## 2021-02-26 MED ORDER — COCONUT OIL OIL: 1.0000 "application " | TOPICAL_OIL | Status: DC | PRN

## 2021-02-26 MED ORDER — BENZOCAINE-MENTHOL 20-0.5 % EX AERO: 1.0000 "application " | INHALATION_SPRAY | CUTANEOUS | Status: DC | PRN

## 2021-02-26 MED ORDER — LACTATED RINGERS IV SOLN: 500.0000 mL | Freq: Once | INTRAVENOUS | Status: DC

## 2021-02-26 MED ORDER — WITCH HAZEL-GLYCERIN EX PADS: 1.0000 "application " | MEDICATED_PAD | CUTANEOUS | Status: DC | PRN

## 2021-02-26 MED ORDER — DIPHENHYDRAMINE HCL 50 MG/ML IJ SOLN: 12.5000 mg | INTRAMUSCULAR | Status: DC | PRN

## 2021-02-26 MED ORDER — MEASLES, MUMPS & RUBELLA VAC IJ SOLR: 0.5000 mL | Freq: Once | INTRAMUSCULAR | Status: DC

## 2021-02-26 MED ORDER — FENTANYL CITRATE (PF) 100 MCG/2ML IJ SOLN: 50.0000 ug | INTRAMUSCULAR | Status: DC | PRN

## 2021-02-26 MED ORDER — OXYTOCIN-SODIUM CHLORIDE 30-0.9 UT/500ML-% IV SOLN: 2.5000 [IU]/h | INTRAVENOUS | Status: DC

## 2021-02-26 MED ORDER — SIMETHICONE 80 MG PO CHEW: 80.0000 mg | CHEWABLE_TABLET | ORAL | Status: DC | PRN

## 2021-02-26 MED ORDER — MISOPROSTOL 25 MCG QUARTER TABLET
25.0000 ug | ORAL_TABLET | ORAL | Status: DC
  Administered 2021-02-26: 25 ug via VAGINAL

## 2021-02-26 NOTE — Lactation Note (Signed)
This note was copied from a baby's chart. Lactation Consultation Note  Patient Name: Nancy Arnold Today's Date: 02/26/2021 Reason for consult: L&D Initial assessment Age:28 hours  P3, Mother states she was unable to breastfeed her first two children due to difficult latch. Baby was latched when [facility] entered room. Baby off and on breast feeding with good rooting cues. Lactation to follow up on MBU.  Maternal Data Does the patient have breastfeeding experience prior to this delivery?:  (P3, First two children would not latch)  Feeding Mother's Current Feeding Choice: Breast Milk  LATCH Score Latch: Grasps breast easily, tongue down, lips flanged, rhythmical sucking.  Audible Swallowing: A few with stimulation  Type of Nipple: Everted at rest and after stimulation  Comfort (Breast/Nipple): Soft / non-tender  Hold (Positioning): Assistance needed to correctly position infant at breast and maintain latch.  LATCH Score: 8    Interventions Interventions: Assisted with latch;Skin to skin;Education   Consult Status Consult Status: Follow-up from L&D    Dahlia Byes [facility] 02/26/2021, 2:54 PM

## 2021-02-26 NOTE — Anesthesia Preprocedure Evaluation (Signed)
Anesthesia Evaluation  Patient identified by MRN, date of birth, ID band Patient awake    Reviewed: Allergy & Precautions, Patient's Chart, lab work & pertinent test results  History of Anesthesia Complications Negative for: history of anesthetic complications  Airway Mallampati: II  TM Distance: >3 FB Neck ROM: Full    Dental no notable dental hx.    Pulmonary neg pulmonary ROS,    Pulmonary exam normal        Cardiovascular negative cardio ROS Normal cardiovascular exam     Neuro/Psych  Headaches, negative psych ROS   GI/Hepatic negative GI ROS, Neg liver ROS,   Endo/Other  negative endocrine ROS  Renal/GU negative Renal ROS  negative genitourinary   Musculoskeletal negative musculoskeletal ROS (+)   Abdominal   Peds  Hematology negative hematology ROS (+)   Anesthesia Other Findings Day of surgery medications reviewed with patient.  Reproductive/Obstetrics (+) Pregnancy                             Anesthesia Physical Anesthesia Plan  ASA: 2  Anesthesia Plan: Epidural   Post-op Pain Management:    Induction:   PONV Risk Score and Plan: Treatment may vary due to age or medical condition  Airway Management Planned: Natural Airway  Additional Equipment: Fetal Monitoring  Intra-op Plan:   Post-operative Plan:   Informed Consent: I have reviewed the patients History and Physical, chart, labs and discussed the procedure including the risks, benefits and alternatives for the proposed anesthesia with the patient or authorized representative who has indicated his/her understanding and acceptance.       Plan Discussed with:   Anesthesia Plan Comments:         Anesthesia Quick Evaluation  

## 2021-02-26 NOTE — Plan of Care (Signed)

## 2021-02-26 NOTE — Anesthesia Procedure Notes (Signed)
Epidural Patient location during procedure: OB Start time: 02/26/2021 10:26 AM End time: 02/26/2021 10:29 AM  Staffing Anesthesiologist: Kaylyn Layer, MD Performed: anesthesiologist   Preanesthetic Checklist Completed: patient identified, IV checked, risks and benefits discussed, monitors and equipment checked, pre-op evaluation and timeout performed  Epidural Patient position: sitting Prep: DuraPrep and site prepped and draped Patient monitoring: continuous pulse ox, blood pressure and heart rate Approach: midline Location: L3-L4 Injection technique: LOR air  Needle:  Needle type: Tuohy  Needle gauge: 17 G Needle length: 9 cm Needle insertion depth: 7 cm Catheter type: closed end flexible Catheter size: 19 Gauge Catheter at skin depth: 12 cm Test dose: negative and Other (1% lidocaine)  Assessment Events: blood not aspirated, injection not painful, no injection resistance, no paresthesia and negative IV test  Additional Notes Patient identified. Risks, benefits, and alternatives discussed with patient including but not limited to bleeding, infection, nerve damage, paralysis, failed block, incomplete pain control, headache, blood pressure changes, nausea, vomiting, reactions to medication, itching, and postpartum back pain. Confirmed with bedside nurse the patient's most recent platelet count. Confirmed with patient that they are not currently taking any anticoagulation, have any bleeding history, or any family history of bleeding disorders. Patient expressed understanding and wished to proceed. All questions were answered. Sterile technique was used throughout the entire procedure. Please see nursing notes for vital signs.   Crisp LOR after 2 needle redirections. Test dose was given through epidural catheter and negative prior to continuing to dose epidural or start infusion. Warning signs of high block given to the patient including shortness of breath, tingling/numbness in  hands, complete motor block, or any concerning symptoms with instructions to call for help. Patient was given instructions on fall risk and not to get out of bed. All questions and concerns addressed with instructions to call with any issues or inadequate analgesia.  Reason for block:procedure for pain

## 2021-02-26 NOTE — H&P (Signed)
Nancy Arnold is a 30 y.o. female presenting for IOL.  Pregnancy uncomplicated.  GBS neg.. OB History     Gravida  3   Para  2   Term  1   Preterm  1   AB  0   Living  2      SAB  0   IAB  0   Ectopic  0   Multiple  0   Live Births  2          Past Medical History:  Diagnosis Date   Migraines    Past Surgical History:  Procedure Laterality Date   ADENOIDECTOMY     EYE SURGERY     EYE SURGERY     age 31  to correct an eye that turned in   TONSILLECTOMY     Family History: family history includes Alcohol abuse in her maternal grandfather; Anesthesia problems in an other family member; Arthritis in her maternal grandmother and paternal grandmother; Asthma in her paternal grandmother; COPD in her paternal grandmother; Cancer in her paternal uncle; Early death in her paternal uncle; Hearing loss in her maternal grandmother; Hypertension in her father, maternal grandmother, and paternal grandmother; Stroke in her paternal grandfather. Social History:  reports that she has never smoked. She has never used smokeless tobacco. She reports that she does not drink alcohol and does not use drugs.     Maternal Diabetes: No Genetic Screening: Normal Maternal Ultrasounds/Referrals: Normal Fetal Ultrasounds or other Referrals:  None Maternal Substance Abuse:  No Significant Maternal Medications:  None Significant Maternal Lab Results:  Group B Strep negative Other Comments:  None  Review of Systems History Dilation: 3 Effacement (%): 70 Station: -1 Exam by:: Neiva Maenza Blood pressure 122/70, pulse 81, temperature 97.6 F (36.4 C), resp. rate 17, height 5\' 4"  (1.626 m), weight 81.1 kg, unknown if currently breastfeeding. Exam Physical Exam  Vitals and nursing note reviewed. Exam conducted with a chaperone present.  Constitutional:      Appearance: Normal appearance.  HENT:     Head: Normocephalic.  Eyes:     Pupils: Pupils are equal, round, and reactive to light.   Cardiovascular:     Rate and Rhythm: Normal rate and regular rhythm.     Pulses: Normal pulses.  Abdominal:     General: Abdomen is Gravid, nontender Neurological:     Mental Status: She is alert.  Prenatal labs: ABO, Rh: --/--/O POS (12/06 0400) Antibody: NEG (12/06 0400) Rubella: Immune (05/11 0000) RPR: Nonreactive (05/11 0000)  HBsAg: Negative (05/11 0000)  HIV: Non-reactive (05/11 0000)  GBS: Negative/-- (11/16 0000)   Assessment/Plan: IUP at term Favorable cx.  AROM now pitocin  Anticipate SVD   06-28-1997 02/26/2021, 9:31 AM

## 2021-02-26 NOTE — Lactation Note (Addendum)
This note was copied from a baby's chart. Lactation Consultation Note  Patient Name: Nancy Arnold Today's Date: 02/26/2021 Reason for consult: Initial assessment;Mother's request;Term;Breastfeeding assistance Age:28 hours  LC assisted with latching, infant did a few suck swallows. Mom feeding plan breast and formula. Infant had recent feeding with formula prior to [facility] arrival.   Plan 1. To feed based on cues 8-12x 24hr period. Mom to offer breasts and look for signs of milk transfer.  2. Mom to supplement with EBM first followed by formula with slow flow nipple and pace bottle feeding. BF supplementation provided. 3. I and O sheet reviewed.  All questions answered at the end of the visit.   Maternal Data Has patient been taught Hand Expression?: Yes  Feeding Mother's Current Feeding Choice: Breast Milk and Formula  LATCH Score Latch: Repeated attempts needed to sustain latch, nipple held in mouth throughout feeding, stimulation needed to elicit sucking reflex.  Audible Swallowing: A few with stimulation  Type of Nipple: Everted at rest and after stimulation  Comfort (Breast/Nipple): Soft / non-tender  Hold (Positioning): Assistance needed to correctly position infant at breast and maintain latch.  LATCH Score: 7   Lactation Tools Discussed/Used    Interventions Interventions: Breast feeding basics reviewed;Assisted with latch;Skin to skin;Breast massage;Hand express;Breast compression;Adjust position;Support pillows;Position options;Expressed milk;Education;Pace feeding;LC Psychologist, educational;Infant Driven Feeding Algorithm education  Discharge Pump: Personal  Consult Status Consult Status: Follow-up Date: 02/27/21 Follow-up type: In-patient    Lema Heinkel  Nicholson-Springer 02/26/2021, 8:54 PM

## 2021-02-27 LAB — CBC
HCT: 33.4 % — ABNORMAL LOW (ref 36.0–46.0)
Hemoglobin: 10.6 g/dL — ABNORMAL LOW (ref 12.0–15.0)
MCH: 25.5 pg — ABNORMAL LOW (ref 26.0–34.0)
MCHC: 31.7 g/dL (ref 30.0–36.0)
MCV: 80.5 fL (ref 80.0–100.0)
Platelets: 189 10*3/uL (ref 150–400)
RBC: 4.15 MIL/uL (ref 3.87–5.11)
RDW: 13.6 % (ref 11.5–15.5)
WBC: 14.6 10*3/uL — ABNORMAL HIGH (ref 4.0–10.5)
nRBC: 0 % (ref 0.0–0.2)

## 2021-02-27 MED ORDER — ACETAMINOPHEN 325 MG PO TABS: 650.0000 mg | ORAL_TABLET | ORAL | 0 refills | Status: AC | PRN

## 2021-02-27 MED ORDER — IBUPROFEN 600 MG PO TABS: 600.0000 mg | ORAL_TABLET | Freq: Four times a day (QID) | ORAL | 0 refills | Status: AC

## 2021-02-27 NOTE — Progress Notes (Signed)
Postpartum Progress Note  Post Partum Day 1 s/p spontaneous vaginal delivery.  Patient reports well-controlled pain, ambulating without difficulty, voiding spontaneously, tolerating PO.  Vaginal bleeding is appropriate.   Objective: Blood pressure 94/64, pulse 77, temperature 98 F (36.7 C), temperature source Oral, resp. rate 17, height 5\' 4"  (1.626 m), weight 81.1 kg, SpO2 98 %, unknown if currently breastfeeding.  Physical Exam:  General: alert and no distress Lochia: appropriate Uterine Fundus: firm DVT Evaluation: No evidence of DVT seen on physical exam.  Recent Labs    02/26/21 0335 02/27/21 0531  HGB 11.7* 10.6*  HCT 36.8 33.4*    Assessment/Plan: Postpartum Day 1, s/p vaginal delivery. Continue routine postpartum care Lactation following Anticipate discharge home later today if baby doing well.   LOS: 1 day   14/07/22 02/27/2021, 7:25 AM

## 2021-02-27 NOTE — Lactation Note (Addendum)
This note was copied from a baby's chart. Lactation Consultation Note  Patient Name: Nancy Arnold Today's Date: 02/27/2021 Reason for consult: Follow-up assessment;Term;Infant weight loss (-2%) Age:28 hours P3, term female infant with -2% weight loss, mom requested early discharge. Mom has [place] DEBP LC discussed mom latching infant on both breast during a feeding, BF infant skin to skin and do breast compression to increase breastfeeding duration such as: breast compressions, talking to infant and gently stroking infant's neck and shoulder. Infant latched with depth, mom latched  infant on her left breast using the football hold position, infant was still breastfeeding when [facility] left the room. LC discussed infant may start cluster feeding 2 day of life and this is normal infant feeding behavior. LC reviewed hand expression using breast module and mom self expressed. LC discussed with discharge: reviewed hand expression, engorgement treatment and prevention, signs of dehydration in infant.  LC discussed outpatient resources: LC hotline number, LC online breastfeeding support group and LC hotline number.  Maternal Data    Feeding Mother's Current Feeding Choice: Breast Milk  LATCH Score Latch: Grasps breast easily, tongue down, lips flanged, rhythmical sucking.  Audible Swallowing: A few with stimulation  Type of Nipple: Everted at rest and after stimulation  Comfort (Breast/Nipple): Soft / non-tender  Hold (Positioning): Assistance needed to correctly position infant at breast and maintain latch.  LATCH Score: 8   Lactation Tools Discussed/Used    Interventions Interventions: Skin to skin;Position options;Education;LC Services brochure;Support pillows;Breast compression  Discharge Discharge Education: Engorgement and breast care;Outpatient recommendation  Consult Status Consult Status: Complete Date: 02/27/21 Follow-up type: Physician    Danelle Earthly 02/27/2021, 4:48 PM

## 2021-02-27 NOTE — Discharge Summary (Signed)
Obstetric Discharge Summary  Nancy Arnold is a 30 y.o. female that presented on 02/26/2021 for elective IOL.  She was admitted to labor and delivery for her induction.  Her labor course was uncomplicated and she delivered a viable female infant on 02/26/2021.  Her postpartum course was uncomplicated and on PPD#1, she reported well controlled pain, spontaneous voiding, ambulating without difficulty, and tolerating PO.  She was stable for discharge home on 02/27/21 with plans for in-office follow up.  Hemoglobin  Date Value Ref Range Status  02/27/2021 10.6 (L) 12.0 - 15.0 g/dL Final  53/74/8270 78.6 11.1 - 15.9 g/dL Final   HCT  Date Value Ref Range Status  02/27/2021 33.4 (L) 36.0 - 46.0 % Final   Hematocrit  Date Value Ref Range Status  04/14/2018 45.3 34.0 - 46.6 % Final    Physical Exam:  General: alert and no distress Lochia: appropriate Uterine Fundus: firm DVT Evaluation: No evidence of DVT seen on physical exam.  Discharge Diagnoses: Term Pregnancy-delivered  Discharge Information: Date: 02/27/2021 Activity: Pelvic rest, as tolerated Diet: routine Medications: Tylenol, motrin Condition: stable Instructions: Refer to practice specific booklet.  Discussed prior to discharge.  Discharge to: Home  Follow-up Information     [facility] Follow up.   Why: Please follow up for 6 week postpartum visit. Contact information: [address] [place] [address] [phone]                 Newborn Data: Live born female  Birth Weight: 7 lb 13.2 oz (3550 g) APGAR: 9, 9  Newborn Delivery   Birth date/time: 02/26/2021 14:09:00 Delivery type: Vaginal, Spontaneous      Home with mother.  Lyn Henri 02/27/2021, 10:30 PM

## 2021-02-27 NOTE — Anesthesia Postprocedure Evaluation (Signed)
Anesthesia Post Note  Patient: [place] W [place]  Procedure(s) Performed: AN AD HOC LABOR EPIDURAL     Patient location during evaluation: Mother Baby Anesthesia Type: Epidural Level of consciousness: awake and alert Pain management: pain level controlled Vital Signs Assessment: post-procedure vital signs reviewed and stable Respiratory status: spontaneous breathing, nonlabored ventilation and respiratory function stable Cardiovascular status: stable Postop Assessment: no headache, no backache and epidural receding Anesthetic complications: no   No notable events documented.  Last Vitals:  Vitals:   02/27/21 0035 02/27/21 0455  BP: (!) 99/59 94/64  Pulse: 85 77  Resp: 18 17  Temp: 36.6 C 36.7 C  SpO2: 98% 98%    Last Pain:  Vitals:   02/27/21 0455  TempSrc: Oral  PainSc:    Pain Goal:                   Trellis Paganini

## 2021-03-04 ENCOUNTER — Inpatient Hospital Stay (HOSPITAL_COMMUNITY): Admit: 2021-03-04 | Payer: Self-pay

## 2021-03-12 ENCOUNTER — Telehealth (HOSPITAL_COMMUNITY): Payer: Self-pay | Admitting: *Deleted

## 2021-03-12 NOTE — Telephone Encounter (Signed)
Attempted hospital discharge follow-up call. Left message for patient to return RN call. Deforest Hoyles, RN, 03/12/21, [phone]

## 2021-06-19 IMAGING — DX DG CHEST 2V
2 series · 2 of 2 positions shown · non-contrast
Comparison: None.

CLINICAL DATA: COVID, cough, left chest pain

EXAM:
CHEST - 2 VIEW

[chest pa]
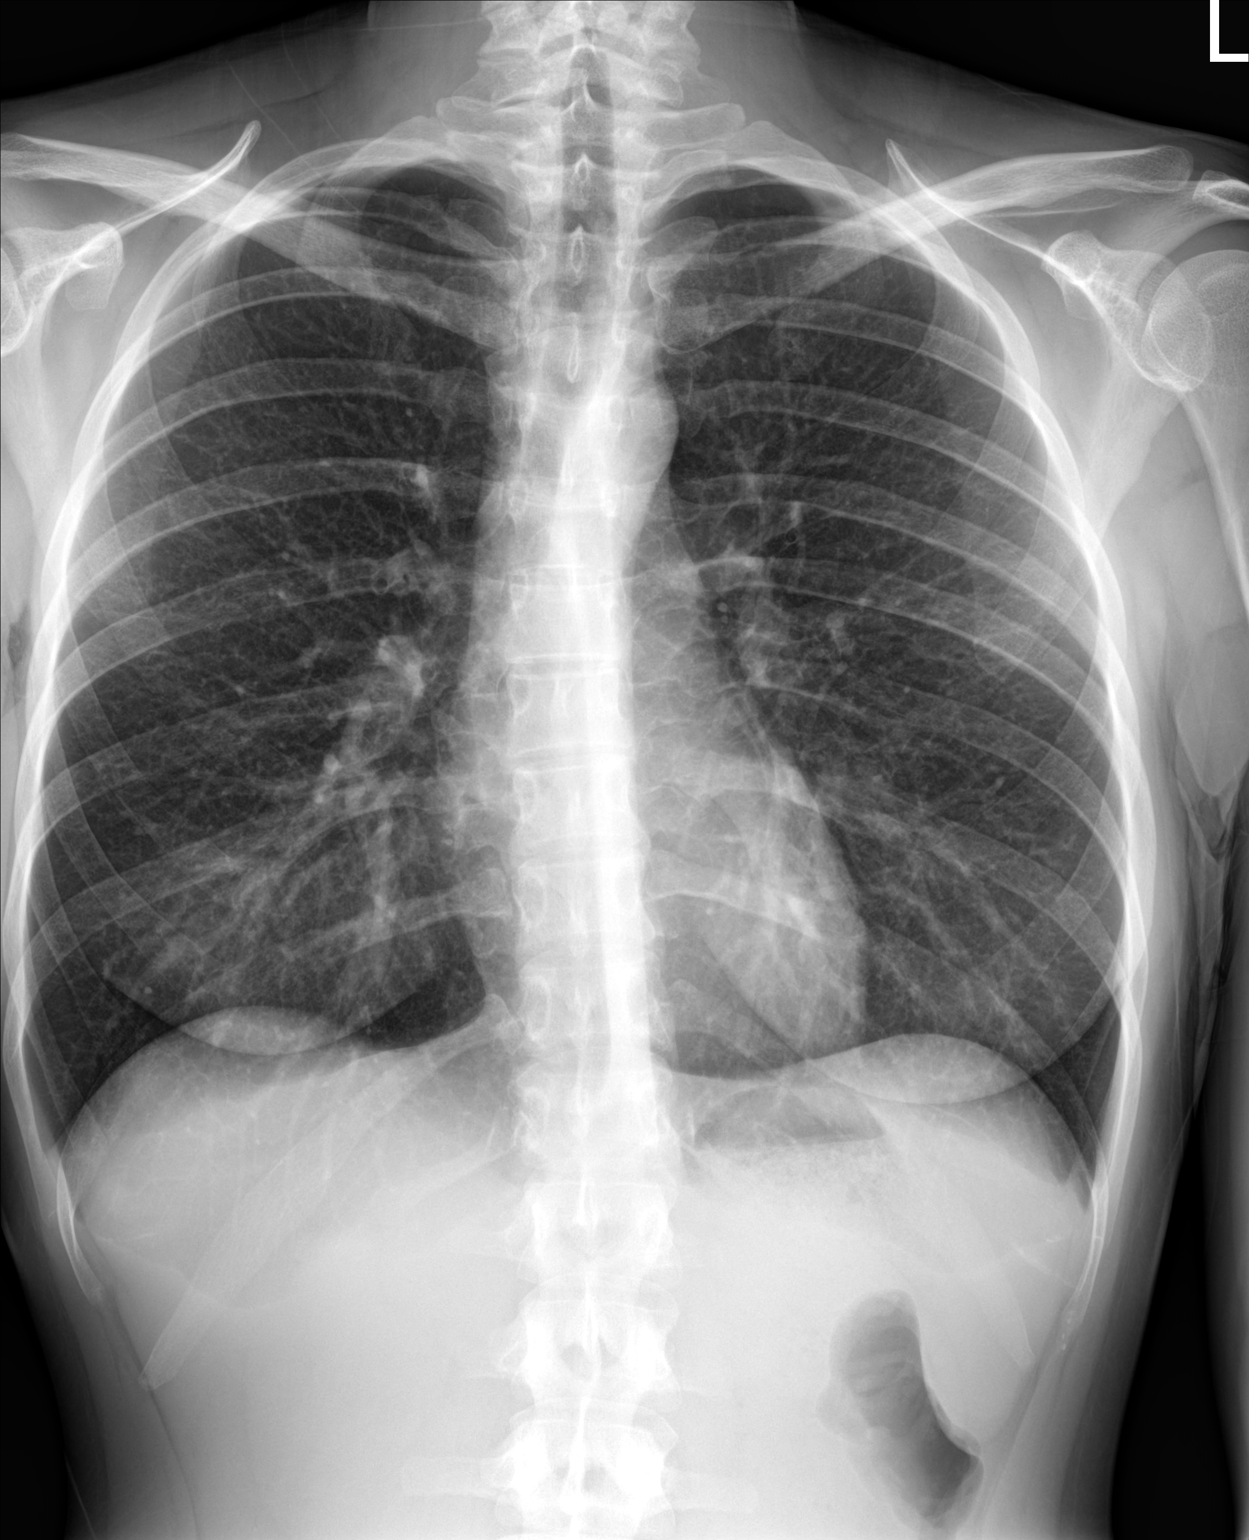

[chest lat]
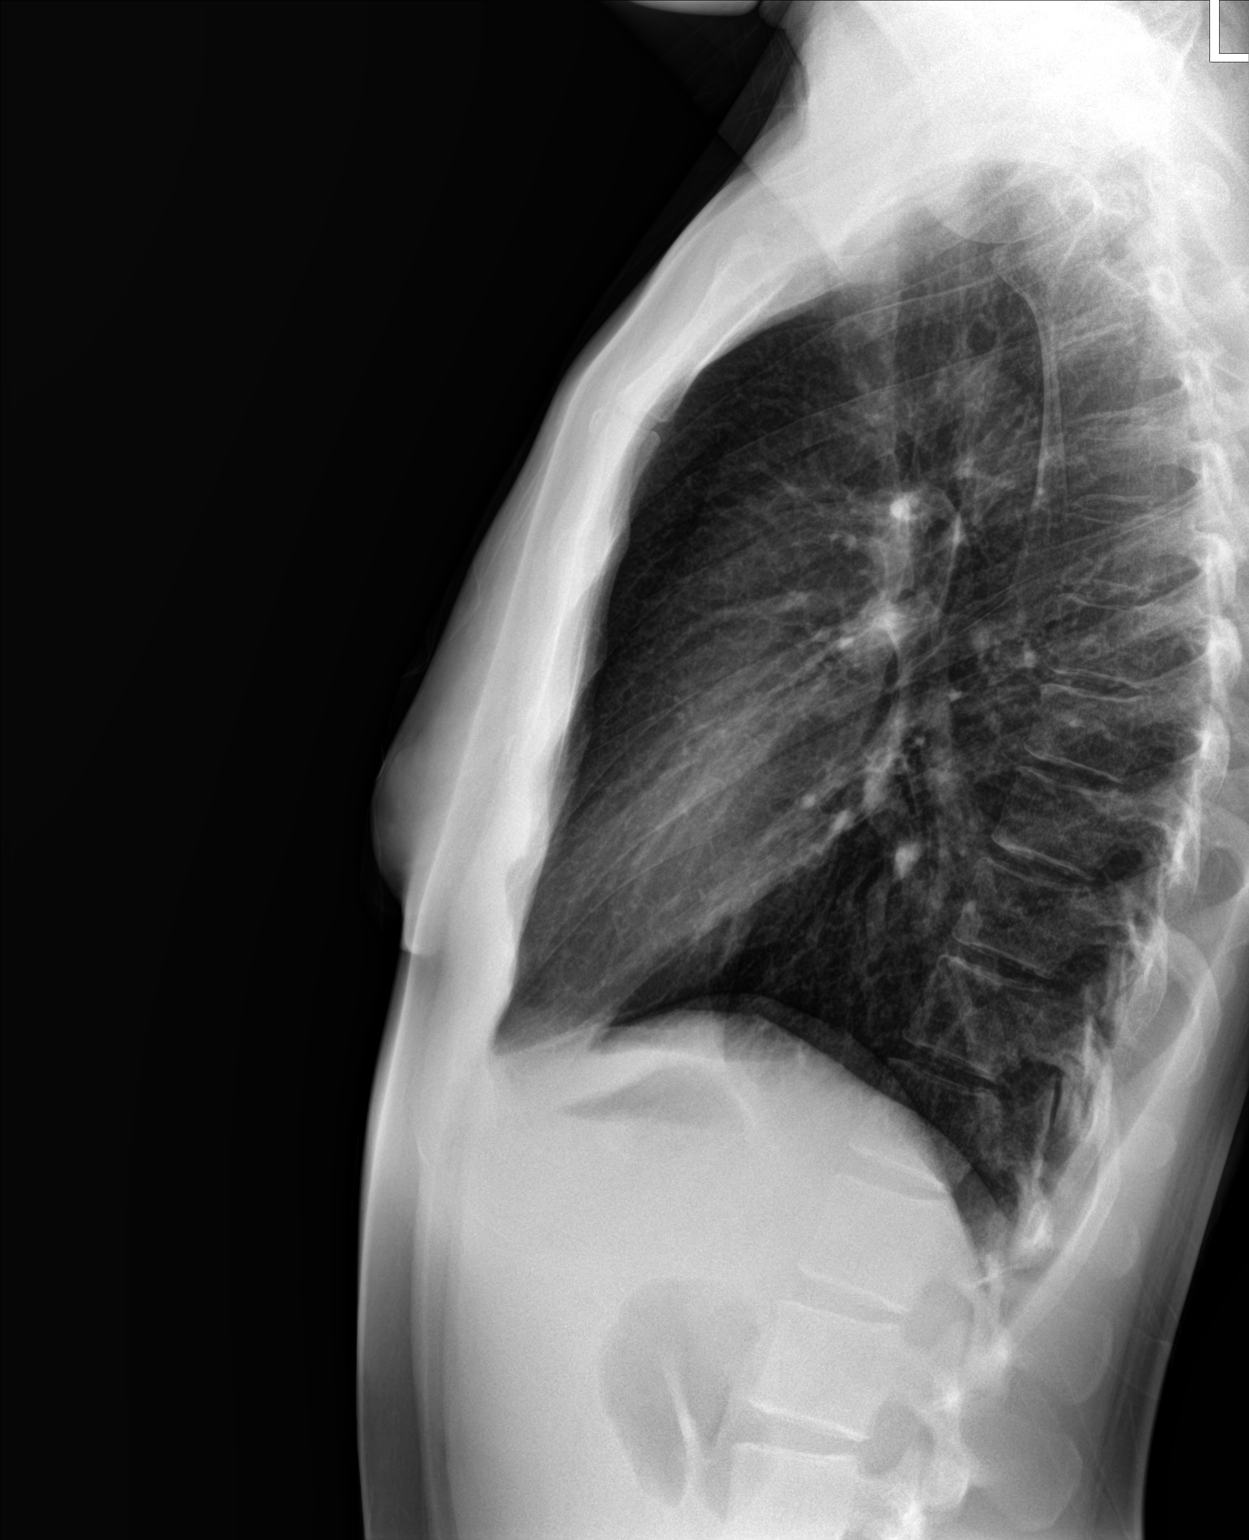

[2 of 2 positions shown; findings below may reference images not displayed]

FINDINGS: Heart and mediastinal contours are within normal limits. No focal
opacities or effusions. No acute bony abnormality.
IMPRESSION: No active cardiopulmonary disease.
